# Patient Record
Sex: Female | Born: 1960 | Race: White | Hispanic: No | Marital: Single | Smoking: Former smoker
Health system: Southern US, Community
[De-identification: ages and names within clinical notes are randomized; demographics above are authoritative.]

## PROBLEM LIST (undated history)

## (undated) DIAGNOSIS — A770 Spotted fever due to Rickettsia rickettsii: Secondary | ICD-10-CM

## (undated) DIAGNOSIS — I10 Essential (primary) hypertension: Secondary | ICD-10-CM

## (undated) DIAGNOSIS — S069X9A Unspecified intracranial injury with loss of consciousness of unspecified duration, initial encounter: Secondary | ICD-10-CM

## (undated) DIAGNOSIS — S069XAA Unspecified intracranial injury with loss of consciousness status unknown, initial encounter: Secondary | ICD-10-CM

## (undated) DIAGNOSIS — K219 Gastro-esophageal reflux disease without esophagitis: Secondary | ICD-10-CM

## (undated) DIAGNOSIS — J45909 Unspecified asthma, uncomplicated: Secondary | ICD-10-CM

## (undated) DIAGNOSIS — I509 Heart failure, unspecified: Secondary | ICD-10-CM

## (undated) HISTORY — DX: Unspecified asthma, uncomplicated: J45.909

---

## 2004-07-25 ENCOUNTER — Ambulatory Visit: Payer: Self-pay

## 2004-08-26 ENCOUNTER — Emergency Department: Payer: Self-pay | Admitting: Emergency Medicine

## 2004-09-03 ENCOUNTER — Ambulatory Visit: Payer: Self-pay | Admitting: Unknown Physician Specialty

## 2010-07-18 ENCOUNTER — Emergency Department: Payer: Self-pay

## 2011-06-24 ENCOUNTER — Emergency Department: Payer: Self-pay | Admitting: *Deleted

## 2012-07-17 ENCOUNTER — Ambulatory Visit: Payer: Self-pay | Admitting: Neurology

## 2013-02-11 ENCOUNTER — Ambulatory Visit: Payer: Self-pay | Admitting: Neurology

## 2013-03-25 ENCOUNTER — Ambulatory Visit: Payer: Self-pay | Admitting: Physical Medicine and Rehabilitation

## 2013-05-26 ENCOUNTER — Ambulatory Visit: Payer: Self-pay | Admitting: Physical Medicine and Rehabilitation

## 2013-06-03 DIAGNOSIS — M2240 Chondromalacia patellae, unspecified knee: Secondary | ICD-10-CM | POA: Insufficient documentation

## 2013-06-07 DIAGNOSIS — M5416 Radiculopathy, lumbar region: Secondary | ICD-10-CM | POA: Insufficient documentation

## 2013-06-07 DIAGNOSIS — M5136 Other intervertebral disc degeneration, lumbar region: Secondary | ICD-10-CM | POA: Insufficient documentation

## 2014-01-04 DIAGNOSIS — E538 Deficiency of other specified B group vitamins: Secondary | ICD-10-CM | POA: Insufficient documentation

## 2014-01-04 DIAGNOSIS — R002 Palpitations: Secondary | ICD-10-CM | POA: Insufficient documentation

## 2014-01-04 DIAGNOSIS — R262 Difficulty in walking, not elsewhere classified: Secondary | ICD-10-CM | POA: Insufficient documentation

## 2014-03-14 DIAGNOSIS — F418 Other specified anxiety disorders: Secondary | ICD-10-CM | POA: Insufficient documentation

## 2014-03-14 DIAGNOSIS — R4589 Other symptoms and signs involving emotional state: Secondary | ICD-10-CM | POA: Insufficient documentation

## 2014-04-24 DIAGNOSIS — F0281 Dementia in other diseases classified elsewhere with behavioral disturbance: Secondary | ICD-10-CM | POA: Insufficient documentation

## 2014-04-24 DIAGNOSIS — F02C18 Dementia in other diseases classified elsewhere, severe, with other behavioral disturbance: Secondary | ICD-10-CM | POA: Insufficient documentation

## 2014-04-24 DIAGNOSIS — S069XAS Unspecified intracranial injury with loss of consciousness status unknown, sequela: Secondary | ICD-10-CM | POA: Insufficient documentation

## 2014-04-25 DIAGNOSIS — Z7289 Other problems related to lifestyle: Secondary | ICD-10-CM | POA: Insufficient documentation

## 2014-04-25 DIAGNOSIS — F1310 Sedative, hypnotic or anxiolytic abuse, uncomplicated: Secondary | ICD-10-CM | POA: Insufficient documentation

## 2014-04-25 DIAGNOSIS — Z789 Other specified health status: Secondary | ICD-10-CM | POA: Insufficient documentation

## 2014-05-21 DIAGNOSIS — S0230XA Fracture of orbital floor, unspecified side, initial encounter for closed fracture: Secondary | ICD-10-CM | POA: Insufficient documentation

## 2014-05-21 DIAGNOSIS — S02401A Maxillary fracture, unspecified, initial encounter for closed fracture: Secondary | ICD-10-CM | POA: Insufficient documentation

## 2014-05-21 DIAGNOSIS — S52609A Unspecified fracture of lower end of unspecified ulna, initial encounter for closed fracture: Secondary | ICD-10-CM | POA: Insufficient documentation

## 2017-03-19 ENCOUNTER — Other Ambulatory Visit: Payer: Self-pay

## 2017-03-19 ENCOUNTER — Emergency Department
Admission: EM | Admit: 2017-03-19 | Discharge: 2017-03-19 | Disposition: A | Discharge status: Home / Self Care | Payer: Medicaid Other | Attending: Emergency Medicine | Admitting: Emergency Medicine

## 2017-03-19 DIAGNOSIS — R51 Headache: Secondary | ICD-10-CM | POA: Insufficient documentation

## 2017-03-19 DIAGNOSIS — R61 Generalized hyperhidrosis: Secondary | ICD-10-CM | POA: Diagnosis not present

## 2017-03-19 DIAGNOSIS — I10 Essential (primary) hypertension: Secondary | ICD-10-CM | POA: Diagnosis not present

## 2017-03-19 DIAGNOSIS — R42 Dizziness and giddiness: Secondary | ICD-10-CM | POA: Insufficient documentation

## 2017-03-19 DIAGNOSIS — Z87820 Personal history of traumatic brain injury: Secondary | ICD-10-CM | POA: Diagnosis not present

## 2017-03-19 DIAGNOSIS — R55 Syncope and collapse: Secondary | ICD-10-CM

## 2017-03-19 HISTORY — DX: Unspecified intracranial injury with loss of consciousness status unknown, initial encounter: S06.9XAA

## 2017-03-19 HISTORY — DX: Gastro-esophageal reflux disease without esophagitis: K21.9

## 2017-03-19 HISTORY — DX: Spotted fever due to Rickettsia rickettsii: A77.0

## 2017-03-19 HISTORY — DX: Unspecified intracranial injury with loss of consciousness of unspecified duration, initial encounter: S06.9X9A

## 2017-03-19 HISTORY — DX: Essential (primary) hypertension: I10

## 2017-03-19 LAB — CBC
HEMATOCRIT: 45.6 % (ref 35.0–47.0)
HEMOGLOBIN: 15.3 g/dL (ref 12.0–16.0)
MCH: 30.9 pg (ref 26.0–34.0)
MCHC: 33.6 g/dL (ref 32.0–36.0)
MCV: 91.8 fL (ref 80.0–100.0)
Platelets: 177 10*3/uL (ref 150–440)
RBC: 4.96 MIL/uL (ref 3.80–5.20)
RDW: 13.2 % (ref 11.5–14.5)
WBC: 8.4 10*3/uL (ref 3.6–11.0)

## 2017-03-19 LAB — URINALYSIS, COMPLETE (UACMP) WITH MICROSCOPIC
BACTERIA UA: NONE SEEN
Bilirubin Urine: NEGATIVE
GLUCOSE, UA: NEGATIVE mg/dL
HGB URINE DIPSTICK: NEGATIVE
Ketones, ur: NEGATIVE mg/dL
Leukocytes, UA: NEGATIVE
NITRITE: NEGATIVE
PROTEIN: NEGATIVE mg/dL
Specific Gravity, Urine: 1.025 (ref 1.005–1.030)
pH: 5 (ref 5.0–8.0)

## 2017-03-19 LAB — BASIC METABOLIC PANEL
ANION GAP: 9 (ref 5–15)
BUN: 21 mg/dL — ABNORMAL HIGH (ref 6–20)
CO2: 23 mmol/L (ref 22–32)
Calcium: 8.8 mg/dL — ABNORMAL LOW (ref 8.9–10.3)
Chloride: 103 mmol/L (ref 101–111)
Creatinine, Ser: 0.44 mg/dL (ref 0.44–1.00)
GLUCOSE: 122 mg/dL — AB (ref 65–99)
POTASSIUM: 3.4 mmol/L — AB (ref 3.5–5.1)
Sodium: 135 mmol/L (ref 135–145)

## 2017-03-19 NOTE — ED Notes (Signed)
Informed RN that patient has been roomed and is ready for evaluation.  Patient in NAD at this time and call bell placed within reach.   

## 2017-03-19 NOTE — ED Notes (Signed)
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
## 2017-03-19 NOTE — ED Notes (Signed)
Report to Allison, RN

## 2017-03-19 NOTE — Discharge Instructions (Signed)
Return to the ER for new, worsening, recurrent dizziness, weakness, passing out, chest pain, difficulty breathing, recurrent or persistent severe headache, or any other new or worsening symptoms that concern you.

## 2017-03-19 NOTE — ED Provider Notes (Signed)
[facility] Emergency Department Provider Note ____________________________________________   First MD Initiated Contact with Patient 03/19/17 1851     (approximate)  I have reviewed the triage vital signs and the nursing notes.   HISTORY  Chief Complaint Near Syncope and Headache    HPI April Chang is a 57 y.o. female with past medical history as noted below who presents with near syncope, acute onset while the patient was in the grocery store, preceded by feeling dull headache as well as feeling hot in her head, sweaty, and lightheaded.  Patient states that she sat down, and the symptoms subsided over the next 20 or 30 minutes.  She denies any symptoms currently and states she feels normal.  No recent trauma or injury.  No recent illness except for an episode of "stomach flu" about 1 week ago.  The patient states that she did eat normally today.  Past Medical History:  Diagnosis Date  . GERD (gastroesophageal reflux disease)   . Hypertension   . [facility] spotted fever   . TBI (traumatic brain injury) (HCC)     There are no active problems to display for this patient.   History reviewed. No pertinent surgical history.  Prior to Admission medications   Not on File    Allergies Patient has no known allergies.  History reviewed. No pertinent family history.  Social History Social History   Tobacco Use  . Smoking status: Never Smoker  Substance Use Topics  . Alcohol use: No    Frequency: Never  . Drug use: Not on file    Review of Systems  Constitutional: No fever/chills. Eyes: No visual changes. ENT: No sore throat. Cardiovascular: Denies chest pain. Respiratory: Denies shortness of breath. Gastrointestinal: No nausea, no vomiting.  No diarrhea.  Genitourinary: Negative for dysuria or hematuria.  Musculoskeletal: Negative for back pain. Skin: Negative for rash. Neurological: Positive for resolved  headache.   ____________________________________________   PHYSICAL EXAM:  VITAL SIGNS: ED Triage Vitals  Enc Vitals Group     BP 03/19/17 1745 112/89     Pulse Rate 03/19/17 1745 (!) 102     Resp 03/19/17 1745 18     Temp 03/19/17 1745 98.6 F (37 C)     Temp Source 03/19/17 1745 Oral     SpO2 03/19/17 1745 98 %     Weight 03/19/17 1753 140 lb (63.5 kg)     Height 03/19/17 1753 5\' 2"  (1.575 m)     Head Circumference --      Peak Flow --      Pain Score 03/19/17 1752 0     Pain Loc --      Pain Edu? --      Excl. in GC? --     Constitutional: Alert and oriented. Well appearing and in no acute distress. Eyes: Conjunctivae are normal.  EOMI.  PERRLA. Head: Atraumatic. Nose: No congestion/rhinnorhea. Mouth/Throat: Mucous membranes are moist.   Neck: Normal range of motion.  Cardiovascular: Normal rate, regular rhythm. Grossly normal heart sounds.  Good peripheral circulation. Respiratory: Normal respiratory effort.  No retractions. Lungs CTAB. Gastrointestinal: No distention.  Musculoskeletal:  Extremities warm and well perfused.  Neurologic:  Normal speech and language.  Motor and sensory intact in all extremities.  Normal coordination with no ataxia.  Skin:  Skin is warm and dry. No rash noted. Psychiatric: Mood and affect are normal. Speech and behavior are normal.  ____________________________________________   LABS (all labs ordered are listed,  but only abnormal results are displayed)  Labs Reviewed  BASIC METABOLIC PANEL - Abnormal; Notable for the following components:      Result Value   Potassium 3.4 (*)    Glucose, Bld 122 (*)    BUN 21 (*)    Calcium 8.8 (*)    All other components within normal limits  URINALYSIS, COMPLETE (UACMP) WITH MICROSCOPIC - Abnormal; Notable for the following components:   Color, Urine YELLOW (*)    APPearance HAZY (*)    Squamous Epithelial / LPF 0-5 (*)    All other components within normal limits  CBC    ____________________________________________  EKG  ED ECG REPORT I, Dionne BucySebastian Deckard Stuber, the attending physician, personally viewed and interpreted this ECG.  Date: 03/19/2017 EKG Time: 1749 Rate: 99 Rhythm: normal sinus rhythm QRS Axis: normal Intervals: normal ST/T Wave abnormalities: Junctional ST depression Narrative Interpretation: no evidence of acute ischemia; no prior EKG available for comparison  ____________________________________________  RADIOLOGY    ____________________________________________   PROCEDURES  Procedure(s) performed: No  Procedures  Critical Care performed: No ____________________________________________   INITIAL IMPRESSION / ASSESSMENT AND PLAN / ED COURSE  Pertinent labs & imaging results that were available during my care of the patient were reviewed by me and considered in my medical decision making (see chart for details).  57 year old female status post TBI and with other PMH as noted above presents with an episode of near syncope while she was in the grocery store today, associated with headache, but now resolved.  Patient is asymptomatic at this time.  Past medical records reviewed in Epic and are noncontributory.  On exam, the patient is well-appearing, the vital signs are normal, his neuro exam is nonfocal, and the remainder the exam is unremarkable.  Presentation is consistent with likely vasovagal near syncope or other benign cause such as mild dehydration.  Initial lab workup is normal.  Neuro exam is nonfocal.  Given that the headache has resolved, but there is no evidence of ICH or other CNS cause, and no indication for imaging.  The patient feels well and would like to go home.  No indication for additional ED workup or observation at this time.  Return precautions given, and the patient and her sister who is with her expressed understanding.   ____________________________________________   FINAL CLINICAL  IMPRESSION(S) / ED DIAGNOSES  Final diagnoses:  Near syncope      NEW MEDICATIONS STARTED DURING THIS VISIT:  There are no discharge medications for this patient.    Note:  This document was prepared using Dragon voice recognition software and may include unintentional dictation errors.    Dionne BucySiadecki, Niklaus Mamaril, MD 03/19/17 2007

## 2017-03-19 NOTE — ED Notes (Signed)
Pt at grocery store with friend earlier today, became flushed and diaphoretic c/o headache. Pt brought to ER after incident which stopped once she got into the car. Has not had any more episodes since. At baseline mentality with short term memory loss, hx of TBI.

## 2020-03-10 DIAGNOSIS — Z20828 Contact with and (suspected) exposure to other viral communicable diseases: Secondary | ICD-10-CM | POA: Diagnosis not present

## 2020-03-21 DIAGNOSIS — I10 Essential (primary) hypertension: Secondary | ICD-10-CM | POA: Diagnosis not present

## 2020-03-21 DIAGNOSIS — S062X9S Diffuse traumatic brain injury with loss of consciousness of unspecified duration, sequela: Secondary | ICD-10-CM | POA: Diagnosis not present

## 2020-03-21 DIAGNOSIS — M159 Polyosteoarthritis, unspecified: Secondary | ICD-10-CM | POA: Diagnosis not present

## 2020-03-21 DIAGNOSIS — R269 Unspecified abnormalities of gait and mobility: Secondary | ICD-10-CM | POA: Diagnosis not present

## 2020-03-21 DIAGNOSIS — Z87891 Personal history of nicotine dependence: Secondary | ICD-10-CM | POA: Diagnosis not present

## 2020-03-21 DIAGNOSIS — R4189 Other symptoms and signs involving cognitive functions and awareness: Secondary | ICD-10-CM | POA: Diagnosis not present

## 2020-03-21 DIAGNOSIS — R21 Rash and other nonspecific skin eruption: Secondary | ICD-10-CM | POA: Diagnosis not present

## 2020-04-13 DIAGNOSIS — Z79899 Other long term (current) drug therapy: Secondary | ICD-10-CM | POA: Diagnosis not present

## 2020-04-13 DIAGNOSIS — I10 Essential (primary) hypertension: Secondary | ICD-10-CM | POA: Diagnosis not present

## 2020-05-21 DIAGNOSIS — Z419 Encounter for procedure for purposes other than remedying health state, unspecified: Secondary | ICD-10-CM | POA: Diagnosis not present

## 2020-06-21 DIAGNOSIS — Z419 Encounter for procedure for purposes other than remedying health state, unspecified: Secondary | ICD-10-CM | POA: Diagnosis not present

## 2020-06-22 DIAGNOSIS — Z79899 Other long term (current) drug therapy: Secondary | ICD-10-CM | POA: Diagnosis not present

## 2020-08-03 DIAGNOSIS — F321 Major depressive disorder, single episode, moderate: Secondary | ICD-10-CM | POA: Diagnosis not present

## 2020-08-03 DIAGNOSIS — F5101 Primary insomnia: Secondary | ICD-10-CM | POA: Diagnosis not present

## 2020-08-16 DIAGNOSIS — R21 Rash and other nonspecific skin eruption: Secondary | ICD-10-CM | POA: Diagnosis not present

## 2020-08-16 DIAGNOSIS — I10 Essential (primary) hypertension: Secondary | ICD-10-CM | POA: Diagnosis not present

## 2020-08-16 DIAGNOSIS — R269 Unspecified abnormalities of gait and mobility: Secondary | ICD-10-CM | POA: Diagnosis not present

## 2020-08-16 DIAGNOSIS — R4189 Other symptoms and signs involving cognitive functions and awareness: Secondary | ICD-10-CM | POA: Diagnosis not present

## 2020-08-30 ENCOUNTER — Encounter: Payer: Self-pay | Admitting: Podiatry

## 2020-08-30 ENCOUNTER — Other Ambulatory Visit: Payer: Self-pay

## 2020-08-30 ENCOUNTER — Ambulatory Visit (INDEPENDENT_AMBULATORY_CARE_PROVIDER_SITE_OTHER): Payer: Medicaid Other | Admitting: Podiatry

## 2020-08-30 DIAGNOSIS — R4020 Unspecified coma: Secondary | ICD-10-CM | POA: Insufficient documentation

## 2020-08-30 DIAGNOSIS — B019 Varicella without complication: Secondary | ICD-10-CM | POA: Insufficient documentation

## 2020-08-30 DIAGNOSIS — A938 Other specified arthropod-borne viral fevers: Secondary | ICD-10-CM | POA: Insufficient documentation

## 2020-08-30 DIAGNOSIS — S069X9A Unspecified intracranial injury with loss of consciousness of unspecified duration, initial encounter: Secondary | ICD-10-CM | POA: Insufficient documentation

## 2020-08-30 DIAGNOSIS — L409 Psoriasis, unspecified: Secondary | ICD-10-CM | POA: Insufficient documentation

## 2020-08-30 DIAGNOSIS — Z0000 Encounter for general adult medical examination without abnormal findings: Secondary | ICD-10-CM | POA: Diagnosis not present

## 2020-08-30 DIAGNOSIS — J45909 Unspecified asthma, uncomplicated: Secondary | ICD-10-CM | POA: Insufficient documentation

## 2020-08-30 DIAGNOSIS — S069XAA Unspecified intracranial injury with loss of consciousness status unknown, initial encounter: Secondary | ICD-10-CM | POA: Insufficient documentation

## 2020-08-30 DIAGNOSIS — I10 Essential (primary) hypertension: Secondary | ICD-10-CM | POA: Insufficient documentation

## 2020-08-30 DIAGNOSIS — K259 Gastric ulcer, unspecified as acute or chronic, without hemorrhage or perforation: Secondary | ICD-10-CM | POA: Insufficient documentation

## 2020-08-30 NOTE — Progress Notes (Signed)
  Subjective:  Patient ID: April Chang, female    DOB: 11/12/1960,  MRN: [id number]  Chief Complaint  Patient presents with   Nail Problem    Thick painful toenails    60 y.o. female present she was referred here by her PCP but she does not know why.  Her caregivers do not know why either.  Objective:  Physical Exam: warm, good capillary refill, no trophic changes or ulcerative lesions, normal DP and PT pulses, and normal sensory exam. Left Foot: normal exam, no swelling, tenderness, instability; ligaments intact, full range of motion of all ankle/foot joints Right Foot: normal exam, no swelling, tenderness, instability; ligaments intact, full range of motion of all ankle/foot joints  Assessment:   1. Healthy adult on routine physical examination      Plan:  Patient was evaluated and treated and all questions answered.  She has no problems with her feet currently.  Return to see me as needed if she develops any issues or has difficulty walking.  Discussed with her she would not qualify for routine regular care but could be a out of pocket if she would like  Return if symptoms worsen or fail to improve.

## 2020-09-21 DIAGNOSIS — Z419 Encounter for procedure for purposes other than remedying health state, unspecified: Secondary | ICD-10-CM | POA: Diagnosis not present

## 2020-10-05 DIAGNOSIS — E785 Hyperlipidemia, unspecified: Secondary | ICD-10-CM | POA: Diagnosis not present

## 2020-10-05 DIAGNOSIS — Z79899 Other long term (current) drug therapy: Secondary | ICD-10-CM | POA: Diagnosis not present

## 2020-10-05 DIAGNOSIS — I10 Essential (primary) hypertension: Secondary | ICD-10-CM | POA: Diagnosis not present

## 2020-10-12 DIAGNOSIS — I10 Essential (primary) hypertension: Secondary | ICD-10-CM | POA: Diagnosis not present

## 2020-10-12 DIAGNOSIS — E782 Mixed hyperlipidemia: Secondary | ICD-10-CM | POA: Diagnosis not present

## 2020-10-12 DIAGNOSIS — E785 Hyperlipidemia, unspecified: Secondary | ICD-10-CM | POA: Diagnosis not present

## 2020-10-21 DIAGNOSIS — Z419 Encounter for procedure for purposes other than remedying health state, unspecified: Secondary | ICD-10-CM | POA: Diagnosis not present

## 2020-11-21 DIAGNOSIS — Z419 Encounter for procedure for purposes other than remedying health state, unspecified: Secondary | ICD-10-CM | POA: Diagnosis not present

## 2020-12-09 ENCOUNTER — Emergency Department
Admission: EM | Admit: 2020-12-09 | Discharge: 2020-12-09 | Disposition: A | Discharge status: Home / Self Care | Payer: Medicaid Other | Attending: Emergency Medicine | Admitting: Emergency Medicine

## 2020-12-09 ENCOUNTER — Other Ambulatory Visit: Payer: Self-pay

## 2020-12-09 DIAGNOSIS — Z79899 Other long term (current) drug therapy: Secondary | ICD-10-CM | POA: Diagnosis not present

## 2020-12-09 DIAGNOSIS — R21 Rash and other nonspecific skin eruption: Secondary | ICD-10-CM | POA: Diagnosis present

## 2020-12-09 DIAGNOSIS — I10 Essential (primary) hypertension: Secondary | ICD-10-CM | POA: Insufficient documentation

## 2020-12-09 DIAGNOSIS — L409 Psoriasis, unspecified: Secondary | ICD-10-CM | POA: Insufficient documentation

## 2020-12-09 DIAGNOSIS — J45909 Unspecified asthma, uncomplicated: Secondary | ICD-10-CM | POA: Diagnosis not present

## 2020-12-09 MED ORDER — CLOTRIMAZOLE-BETAMETHASONE 1-0.05 % EX CREA: TOPICAL_CREAM | CUTANEOUS | 3 refills | Status: DC

## 2020-12-09 NOTE — ED Notes (Signed)
Provider reviewed discharge instructions, follow-up carewith patient. Patient verbalized understanding of all information reviewed. Patient stable, with no distress noted at this time.

## 2020-12-09 NOTE — ED Triage Notes (Signed)
Pt to ED with sister for rash under breasts, under arms, and at groin area for over 6 weeks. Was recently taken out of facility. TBI

## 2020-12-09 NOTE — ED Provider Notes (Signed)
[facility] Emergency Department Provider Note  ____________________________________________   Event Date/Time   First MD Initiated Contact with Patient 12/09/20 1429     (approximate)  I have reviewed the triage vital signs and the nursing notes.   HISTORY  Chief Complaint Rash    HPI Allea Kassner is a 60 y.o. female presents emergency department with a rash under her breast, underarms and between her legs for over 6 weeks.  Patient has a history of psoriasis.  States she has had a large outbreak after staying in [facility].  Does not have any cream to apply to it.  Has an appointment with dermatology in 1 month.  Past Medical History:  Diagnosis Date   GERD (gastroesophageal reflux disease)    Hypertension    [facility] spotted fever    TBI (traumatic brain injury)     Patient Active Problem List   Diagnosis Date Noted   Asthma without status asthmaticus 08/30/2020   Brain trauma 08/30/2020   Chicken pox 08/30/2020   Coma (HCC) 08/30/2020   Gastric ulcer 08/30/2020   Hypertension 08/30/2020   Psoriasis 08/30/2020   Tick fever 08/30/2020   Closed fracture of distal end of ulna 05/21/2014   Closed fracture of maxillary sinus (HCC) 05/21/2014   Closed fracture of orbital floor (HCC) 05/21/2014   Alcohol use 04/25/2014   Benzodiazepine abuse (HCC) 04/25/2014   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  Prior to Admission medications   Medication Sig Start Date End Date Taking? Authorizing Provider  clotrimazole-betamethasone (LOTRISONE) cream Apply to affected area 2 times  daily 12/09/20 12/09/21 Yes Ariahna Smiddy, Roselyn Bering, PA-C  acetaminophen (TYLENOL) 650 MG CR tablet Take by mouth.    [provider]  Artificial Tear Solution (JUST TEARS EYE DROPS) SOLN Administer 1 drop to both eyes Three (3) times a day. 05/21/14   [provider]  B Complex CAPS Take by mouth. 07/21/20   [provider]  baclofen (LIORESAL) 10 MG tablet Take 1 tablet by mouth daily. 01/04/14   [provider]  Cholecalciferol 25 MCG (1000 UT) tablet Take by mouth.    [provider]  cyanocobalamin 1000 MCG tablet Take by mouth.    [provider]  fiber (NUTRISOURCE FIBER) PACK packet Take by mouth. 05/21/14   [provider]  gabapentin (NEURONTIN) 100 MG capsule 1 po qhs x 4 days, then bid x 4 days, then tid thereafter. 06/07/13   [provider]  ibuprofen (ADVIL) 200 MG tablet Take by mouth.    [provider]  meloxicam (MOBIC) 15 MG tablet Take by mouth. 07/21/20   [provider]  metoprolol tartrate (LOPRESSOR) 25 MG tablet Take by mouth. 07/21/20   [provider]  [facility] powder Apply topically. 08/10/20   [provider]  OLANZapine (ZYPREXA) 2.5 MG tablet Take by mouth. 05/21/14   [provider]  rosuvastatin (CRESTOR) 5 MG tablet Take by mouth. 07/21/20   [provider]  sertraline (ZOLOFT) 50 MG tablet Take by mouth. 07/21/20   [provider]  tacrolimus (PROTOPIC) 0.1 % ointment SMARTSIG:1 Topical Daily PRN 08/17/20   [provider]  traZODone (DESYREL) 50 MG tablet Take by mouth. 05/21/14   [provider]    Allergies Patient has no known allergies.  No family history on file.  Social History Social History   Tobacco Use   Smoking status: Never  Substance Use Topics   Alcohol use: No    Review of Systems  Constitutional: No fever/chills Eyes: No visual changes. ENT: No sore throat. Respiratory: Denies cough Cardiovascular:  Denies chest pain Gastrointestinal: Denies abdominal pain Genitourinary: Negative for dysuria. Musculoskeletal: Negative for back pain. Skin: Positive for rash. Psychiatric: no mood changes,     ____________________________________________   PHYSICAL EXAM:  VITAL SIGNS: ED Triage Vitals  Enc Vitals Group     BP 12/09/20 1425 (!) 128/99     Pulse Rate 12/09/20 1425 98     Resp 12/09/20 1425 20     Temp 12/09/20 1425 98 F (36.7 C)     Temp Source 12/09/20 1425 Oral     SpO2 12/09/20 1425 95 %     Weight 12/09/20 1426 160 lb (72.6 kg)     Height 12/09/20 1426 5\' 7"  (1.702 m)     Head Circumference --      Peak Flow --      Pain Score 12/09/20 1425 6     Pain Loc --      Pain Edu? --      Excl. in GC? --     Constitutional: Alert and oriented. Well appearing and in no acute distress. Eyes: Conjunctivae are normal.  Head: Atraumatic. Nose: No congestion/rhinnorhea. Mouth/Throat: Mucous membranes are moist.   Neck:  supple no lymphadenopathy noted Cardiovascular: Normal rate, regular rhythm. Heart sounds are normal Respiratory: Normal respiratory effort.  No retractions, lungs c t a  GU: deferred Musculoskeletal: FROM all extremities, warm and well perfused Neurologic:  Normal speech and language.  Skin:  Skin is warm, dry and intact.  Large red areas of psoriasis noted under the breast, underneath arms, and in between the legs, flaky scaly skin noted at the back of the neck typical of psoriasis, no drainage noted  psychiatric: Mood and affect are normal. Speech and behavior are normal.  ____________________________________________   LABS (all labs ordered are listed, but only abnormal results are displayed)  Labs Reviewed - No data to display ____________________________________________   ____________________________________________  RADIOLOGY    ____________________________________________   PROCEDURES  Procedure(s) performed:  No  Procedures    ____________________________________________   INITIAL IMPRESSION / ASSESSMENT AND PLAN / ED COURSE  Pertinent labs & imaging results that were available during my care of the patient were reviewed by me and considered in my medical decision making (see chart for details).   Patient is 60 year old female presents with rash.  See HPI.  Physical exam shows patient be stable.  Due to the concerns of psoriasis and questionable yeast under her breast hysterometry is a so that she will benefit from the antifungal and steroid pain.  The patient keep her appointment with dermatology.  Return emergency department worsening.  She was discharged stable condition and in agreement with treatment plan     Yaffa Szeliga was evaluated in Emergency Department on 12/09/2020 for the symptoms described in the history of present illness. She was evaluated in the context of the global COVID-19 pandemic, which necessitated consideration that the patient might be at risk for infection with the SARS-CoV-2 virus that causes COVID-19. Institutional protocols and algorithms that pertain to the evaluation of patients at  risk for COVID-19 are in a state of rapid change based on information released by regulatory bodies including the CDC and federal and state organizations. These policies and algorithms were followed during the patient's care in the ED.    As part of my medical decision making, I reviewed the following data within the electronic MEDICAL RECORD NUMBER History obtained from family, Nursing notes reviewed and incorporated, Old chart reviewed, Notes from prior ED visits, and [place] Controlled Substance Database  ____________________________________________   FINAL CLINICAL IMPRESSION(S) / ED DIAGNOSES  Final diagnoses:  Psoriasis      NEW MEDICATIONS STARTED DURING THIS VISIT:  Discharge Medication List as of 12/09/2020  2:43 PM     START taking these medications   Details   clotrimazole-betamethasone (LOTRISONE) cream Apply to affected area 2 times daily, Normal         Note:  This document was prepared using Dragon voice recognition software and may include unintentional dictation errors.    Faythe Ghee, PA-C 12/09/20 1544    Sharyn Creamer, MD 12/09/20 [facility] [place]

## 2020-12-11 ENCOUNTER — Telehealth: Payer: Self-pay

## 2020-12-11 NOTE — Telephone Encounter (Signed)
Transition Care Management Unsuccessful Follow-up Telephone Call  Date of discharge and from where:  12/09/2020 from [facility]  Attempts:  1st Attempt  Reason for unsuccessful TCM follow-up call:  Left voice message    

## 2020-12-12 NOTE — Telephone Encounter (Signed)
Transition Care Management Follow-up Telephone Call Date of discharge and from where: 12/09/2020 from [facility] How have you been since you were released from the hospital? Spoke to pt sister and gaurdian Barbaraann Share).  Any questions or concerns? No  Items Reviewed: Did the pt receive and understand the discharge instructions provided? Yes  Medications obtained and verified? Yes  Other? No  Any new allergies since your discharge? No  Dietary orders reviewed? No Do you have support at home? Yes   Functional Questionnaire: (I = Independent and D = Dependent) ADLs: I  Bathing/Dressing- I  Meal Prep- I  Eating- I  Maintaining continence- I  Transferring/Ambulation- I  Managing Meds- I  Follow up appointments reviewed:  PCP Hospital f/u appt confirmed? No   Specialist Hospital f/u appt confirmed? Yes  Scheduled to see Sandi Mealy, MD on 01/09/2021 @ 1:40 pm. Are transportation arrangements needed? No  If their condition worsens, is the pt aware to call PCP or go to the Emergency Dept.? Yes Was the patient provided with contact information for the PCP's office or ED? Yes Was to pt encouraged to call back with questions or concerns? Yes

## 2020-12-21 DIAGNOSIS — Z419 Encounter for procedure for purposes other than remedying health state, unspecified: Secondary | ICD-10-CM | POA: Diagnosis not present

## 2021-01-03 DIAGNOSIS — M2550 Pain in unspecified joint: Secondary | ICD-10-CM | POA: Diagnosis not present

## 2021-01-03 DIAGNOSIS — Z0130 Encounter for examination of blood pressure without abnormal findings: Secondary | ICD-10-CM | POA: Diagnosis not present

## 2021-01-03 DIAGNOSIS — H6123 Impacted cerumen, bilateral: Secondary | ICD-10-CM | POA: Diagnosis not present

## 2021-01-03 DIAGNOSIS — K219 Gastro-esophageal reflux disease without esophagitis: Secondary | ICD-10-CM | POA: Diagnosis not present

## 2021-01-03 DIAGNOSIS — L409 Psoriasis, unspecified: Secondary | ICD-10-CM | POA: Diagnosis not present

## 2021-01-03 DIAGNOSIS — Z13220 Encounter for screening for lipoid disorders: Secondary | ICD-10-CM | POA: Diagnosis not present

## 2021-01-03 DIAGNOSIS — Z131 Encounter for screening for diabetes mellitus: Secondary | ICD-10-CM | POA: Diagnosis not present

## 2021-01-03 DIAGNOSIS — Z1159 Encounter for screening for other viral diseases: Secondary | ICD-10-CM | POA: Diagnosis not present

## 2021-01-03 DIAGNOSIS — I10 Essential (primary) hypertension: Secondary | ICD-10-CM | POA: Diagnosis not present

## 2021-01-03 DIAGNOSIS — Z1389 Encounter for screening for other disorder: Secondary | ICD-10-CM | POA: Diagnosis not present

## 2021-01-03 DIAGNOSIS — Z7189 Other specified counseling: Secondary | ICD-10-CM | POA: Diagnosis not present

## 2021-01-05 ENCOUNTER — Other Ambulatory Visit: Payer: Self-pay | Admitting: Student

## 2021-01-05 DIAGNOSIS — Z1231 Encounter for screening mammogram for malignant neoplasm of breast: Secondary | ICD-10-CM

## 2021-01-09 ENCOUNTER — Ambulatory Visit: Payer: Medicaid Other | Admitting: Dermatology

## 2021-01-11 ENCOUNTER — Ambulatory Visit: Payer: Medicaid Other | Admitting: Dermatology

## 2021-01-21 DIAGNOSIS — Z419 Encounter for procedure for purposes other than remedying health state, unspecified: Secondary | ICD-10-CM | POA: Diagnosis not present

## 2021-02-13 ENCOUNTER — Ambulatory Visit (INDEPENDENT_AMBULATORY_CARE_PROVIDER_SITE_OTHER): Payer: Medicaid Other | Admitting: Dermatology

## 2021-02-13 ENCOUNTER — Other Ambulatory Visit: Payer: Self-pay

## 2021-02-13 DIAGNOSIS — L409 Psoriasis, unspecified: Secondary | ICD-10-CM | POA: Diagnosis not present

## 2021-02-13 MED ORDER — VTAMA 1 % EX CREA: 1.0000 "application " | TOPICAL_CREAM | Freq: Every day | CUTANEOUS | 2 refills | Status: DC
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
## 2021-02-13 NOTE — Patient Instructions (Addendum)
Psoriasis is a chronic non-curable, but treatable genetic/hereditary disease that may have other systemic features affecting other organ systems such as joints (Psoriatic Arthritis). It is associated with an increased risk of inflammatory bowel disease, heart disease, non-alcoholic fatty liver disease, and depression.    Side effects of Otezla (apremilast) include diarrhea, nausea, headache, upper respiratory infection, depression, and weight decrease (5-10%). It should only be taken by pregnant women after a discussion regarding risks and benefits with their doctor. Goal is control of skin condition, not cure.  The use of [place] requires long term medication management, including periodic office visits.  If You Need Anything After Your Visit  If you have any questions or concerns for your doctor, please call our main line at [phone] and press option 4 to reach your doctor's medical assistant. If no one answers, please leave a voicemail as directed and we will return your call as soon as possible. Messages left after 4 pm will be answered the following business day.   You may also send [place] a message via MyChart. We typically respond to MyChart messages within 1-2 business days.  For prescription refills, please ask your pharmacy to contact our office. Our fax number is [phone].  If you have an urgent issue when the clinic is closed that cannot wait until the next business day, you can page your doctor at the number below.    Please note that while we do our best to be available for urgent issues outside of office hours, we are not available 24/7.   If you have an urgent issue and are unable to reach [place], you may choose to seek medical care at your doctor's office, retail clinic, urgent care center, or emergency room.  If you have a medical emergency, please immediately call 911 or go to the emergency department.  Pager Numbers  - Dr. Gwen Pounds: [phone]  - Dr. Neale Burly:  [phone]  - Dr. Roseanne Reno: [phone]  In the event of inclement weather, please call our main line at [phone] for an update on the status of any delays or closures.  Dermatology Medication Tips: Please keep the boxes that topical medications come in in order to help keep track of the instructions about where and how to use these. Pharmacies typically print the medication instructions only on the boxes and not directly on the medication tubes.   If your medication is too expensive, please contact our office at [phone] option 4 or send [place] a message through MyChart.   We are unable to tell what your co-pay for medications will be in advance as this is different depending on your insurance coverage. However, we may be able to find a substitute medication at lower cost or fill out paperwork to get insurance to cover a needed medication.   If a prior authorization is required to get your medication covered by your insurance company, please allow [place] 1-2 business days to complete this process.  Drug prices often vary depending on where the prescription is filled and some pharmacies may offer cheaper prices.  The website www.goodrx.com contains coupons for medications through different pharmacies. The prices here do not account for what the cost may be with help from insurance (it may be cheaper with your insurance), but the website can give you the price if you did not use any insurance.  - You can print the associated coupon and take it with your prescription to the pharmacy.  - You may also stop by our office during regular  business hours and pick up a GoodRx coupon card.  - If you need your prescription sent electronically to a different pharmacy, notify our office through [facility] or by phone at [phone] option 4.     Si Usted Necesita Algo Despus de Su Visita  Tambin puede enviarnos un mensaje a travs de Clinical cytogeneticist. Por lo general respondemos a los mensajes de  MyChart en el transcurso de 1 a 2 das hbiles.  Para renovar recetas, por favor pida a su farmacia que se ponga en contacto con nuestra oficina. Annie Sable de fax es [place] [phone].  Si tiene un asunto urgente cuando la clnica est cerrada y que no puede esperar hasta el siguiente da hbil, puede llamar/localizar a su doctor(a) al nmero que aparece a continuacin.   Por favor, tenga en cuenta que aunque hacemos todo lo posible para estar disponibles para asuntos urgentes fuera del horario de [place], no estamos disponibles las 24 horas del da, los 7 [address]  [address] de la [place].   Si tiene un problema urgente y no puede comunicarse con nosotros, puede optar por buscar atencin mdica  en el consultorio de su doctor(a), en una clnica privada, en un centro de atencin urgente o en una sala de emergencias.  Si tiene Engineer, drilling, por favor llame inmediatamente al 911 o vaya a la sala de emergencias.  Nmeros de bper  - Dr. Gwen Pounds: [phone]  - Dra. Moye: [phone]  - Dra. Roseanne Reno: [phone]  En caso de inclemencias del [place], por favor llame a Lacy Duverney principal al [phone] para una actualizacin sobre el [place] de cualquier retraso o cierre.  Consejos para la medicacin en dermatologa: Por favor, guarde las cajas en las que vienen los medicamentos de uso tpico para ayudarle a seguir las instrucciones sobre dnde y cmo usarlos. Las farmacias generalmente imprimen las instrucciones del medicamento slo en las cajas y no directamente en los tubos del [place].   Si su medicamento es muy caro, por favor, pngase en contacto con Rolm Gala llamando al [phone] y presione la opcin 4 o envenos un mensaje a travs de Clinical cytogeneticist.   No podemos decirle cul ser su copago por los medicamentos por adelantado ya que esto es diferente dependiendo de la cobertura de su seguro. Sin embargo, es posible que podamos encontrar un medicamento sustituto a Insurance account manager un formulario para que el seguro cubra el medicamento que se considera necesario.   Si se requiere una autorizacin previa para que su compaa de seguros [place] su medicamento, por favor permtanos de 1 a 2 das hbiles para completar [address]  Los precios de los medicamentos varan con frecuencia dependiendo del Environmental consultant de dnde se surte la receta y alguna farmacias pueden ofrecer precios ms baratos.  El sitio web www.goodrx.com tiene cupones para medicamentos de Health and safety inspector. Los precios aqu no tienen en cuenta lo que podra costar con la ayuda del seguro (puede ser ms barato con su seguro), pero el sitio web puede darle el precio si no utiliz Tourist information centre manager.  - Puede imprimir el cupn correspondiente y llevarlo con su receta a la farmacia.  - Tambin puede pasar por nuestra oficina durante el horario de atencin regular y Education officer, museum una tarjeta de cupones de GoodRx.  - Si necesita que su receta se enve electrnicamente a una farmacia diferente, informe a nuestra oficina a travs de MyChart de [place] o por telfono llamando al [phone] y presione la opcin 4.

## 2021-02-16 ENCOUNTER — Encounter: Payer: Self-pay | Admitting: Dermatology

## 2021-02-21 DIAGNOSIS — Z419 Encounter for procedure for purposes other than remedying health state, unspecified: Secondary | ICD-10-CM | POA: Diagnosis not present

## 2021-02-26 ENCOUNTER — Telehealth: Payer: Self-pay

## 2021-02-26 NOTE — Telephone Encounter (Signed)
Pt called April Chang on VM no improvement on her rash,    Called pt April Chang on VM please return my call to discuss which cream is she using on her rash

## 2021-02-27 ENCOUNTER — Telehealth: Payer: Self-pay

## 2021-02-27 NOTE — Telephone Encounter (Signed)
Recommend adding clobetasol solution twice a day as needed. Avoid applying to face, groin, and axilla. Use as directed. Long-term use can cause thinning of the skin which is not usually much of an issue at the scalp but is more of a concern at other body locations. Thank you!

## 2021-02-27 NOTE — Telephone Encounter (Signed)
Pt's sister  calling back to see if Dr Neale Burly can prescribe Fluocinonide solution because it worked in the past,

## 2021-02-27 NOTE — Telephone Encounter (Signed)
Patient's sister called about currently flare in scalp. They state the Dwaine Gale is now working and asking for something different to use in that area.

## 2021-02-28 MED ORDER — CLOBETASOL PROPIONATE 0.05 % EX SOLN: 1.0000 "application " | Freq: Two times a day (BID) | CUTANEOUS | 0 refills | Status: DC

## 2021-02-28 NOTE — Telephone Encounter (Signed)
Sure. The clobetasol is stronger and may be more effective but the fluocinonide is fine if that's what they prefer.

## 2021-02-28 NOTE — Telephone Encounter (Signed)
Patient's sister advised and RX sent in. She decided to try and use Clobetasol Solution. aw

## 2021-03-02 DIAGNOSIS — Z0130 Encounter for examination of blood pressure without abnormal findings: Secondary | ICD-10-CM | POA: Diagnosis not present

## 2021-03-02 DIAGNOSIS — Z0000 Encounter for general adult medical examination without abnormal findings: Secondary | ICD-10-CM | POA: Diagnosis not present

## 2021-03-02 DIAGNOSIS — I10 Essential (primary) hypertension: Secondary | ICD-10-CM | POA: Diagnosis not present

## 2021-03-02 DIAGNOSIS — K219 Gastro-esophageal reflux disease without esophagitis: Secondary | ICD-10-CM | POA: Diagnosis not present

## 2021-03-02 DIAGNOSIS — J3089 Other allergic rhinitis: Secondary | ICD-10-CM | POA: Diagnosis not present

## 2021-03-13 ENCOUNTER — Ambulatory Visit (INDEPENDENT_AMBULATORY_CARE_PROVIDER_SITE_OTHER): Payer: Medicaid Other | Admitting: Dermatology

## 2021-03-13 ENCOUNTER — Other Ambulatory Visit: Payer: Self-pay

## 2021-03-13 ENCOUNTER — Encounter: Payer: Self-pay | Admitting: Dermatology

## 2021-03-13 DIAGNOSIS — L409 Psoriasis, unspecified: Secondary | ICD-10-CM

## 2021-03-13 DIAGNOSIS — Z79899 Other long term (current) drug therapy: Secondary | ICD-10-CM

## 2021-03-13 MED ORDER — ENSTILAR 0.005-0.064 % EX FOAM: CUTANEOUS | 2 refills | Status: DC

## 2021-03-13 NOTE — Progress Notes (Signed)
° °  Follow-Up Visit   Subjective  April Chang is a 61 y.o. female who presents for the following: Psoriasis (1 month f/u Psoriasis on scalp and chest, treating chest with Vtama cream with a good response, treating scalp with Clobetasol solution with a poor response).  Sister with patient   The following portions of the chart were reviewed this encounter and updated as appropriate:   Tobacco   Allergies   Meds   Problems   Med Hx   Surg Hx   Fam Hx       Review of Systems:  No other skin or systemic complaints except as noted in HPI or Assessment and Plan.  Objective  Well appearing patient in no apparent distress; mood and affect are within normal limits.  A focused examination was performed including scalp,chest. Relevant physical exam findings are noted in the Assessment and Plan.  chest,scalp Thick scaly plaques wide spread over the scalp, rare scaly pink plaque at back, erythematous patches at chest and inframammary     Assessment & Plan  Psoriasis chest,scalp  Chronic and persistent condition with duration or expected duration over one year. Condition is bothersome/symptomatic for patient. Currently flared at scalp despite topical clobetasol solution. She was unable to spread Vtama effectively at the scalp.  Psoriasis is a chronic non-curable, but treatable genetic/hereditary disease that may have other systemic features affecting other organ systems such as joints (Psoriatic Arthritis). It is associated with an increased risk of inflammatory bowel disease, heart disease, non-alcoholic fatty liver disease, and depression.     BSA 5%  If no better in the future we may consider Xtrac or biologic   Start samples of Otezla, titrate up to BID (2 packets given) Lot [id number] Exp May 31,2024  Side effects of Otezla (apremilast) include diarrhea, nausea, headache, upper respiratory infection, depression, and weight decrease (5-10%). It should only be taken by pregnant women  after a discussion regarding risks and benefits with their doctor. Goal is control of skin condition, not cure.  The use of [place] requires long term medication management, including periodic office visits.  -Negative history of depression   Start Enstilar foam apply to scalp daily  Start otc T-gel or T-sal or T-gel shampoo  Related Medications Tapinarof (VTAMA) 1 % CREA Apply 1 application topically daily. To affected areas of psoriasis   Return in about 4 weeks (around 04/10/2021) for Psoriasis .  I, Angelique Holm, CMA, am acting as scribe for Darden Dates, MD .   Documentation: I have reviewed the above documentation for accuracy and completeness, and I agree with the above.  Darden Dates, MD

## 2021-03-13 NOTE — Patient Instructions (Addendum)
Start over the counter T- sal shampoo or T-gel shampoo   If You Need Anything After Your Visit  If you have any questions or concerns for your doctor, please call our main line at [phone] and press option 4 to reach your doctor's medical assistant. If no one answers, please leave a voicemail as directed and we will return your call as soon as possible. Messages left after 4 pm will be answered the following business day.   You may also send [place] a message via MyChart. We typically respond to MyChart messages within 1-2 business days.  For prescription refills, please ask your pharmacy to contact our office. Our fax number is [phone].  If you have an urgent issue when the clinic is closed that cannot wait until the next business day, you can page your doctor at the number below.    Please note that while we do our best to be available for urgent issues outside of office hours, we are not available 24/7.   If you have an urgent issue and are unable to reach [place], you may choose to seek medical care at your doctor's office, retail clinic, urgent care center, or emergency room.  If you have a medical emergency, please immediately call 911 or go to the emergency department.  Pager Numbers  - Dr. Gwen Pounds: [phone]  - Dr. Neale Burly: [phone]  - Dr. Roseanne Reno: [phone]  In the event of inclement weather, please call our main line at [phone] for an update on the status of any delays or closures.  Dermatology Medication Tips: Please keep the boxes that topical medications come in in order to help keep track of the instructions about where and how to use these. Pharmacies typically print the medication instructions only on the boxes and not directly on the medication tubes.   If your medication is too expensive, please contact our office at [phone] option 4 or send [place] a message through MyChart.   We are unable to tell what your co-pay for medications will be in advance as  this is different depending on your insurance coverage. However, we may be able to find a substitute medication at lower cost or fill out paperwork to get insurance to cover a needed medication.   If a prior authorization is required to get your medication covered by your insurance company, please allow [place] 1-2 business days to complete this process.  Drug prices often vary depending on where the prescription is filled and some pharmacies may offer cheaper prices.  The website www.goodrx.com contains coupons for medications through different pharmacies. The prices here do not account for what the cost may be with help from insurance (it may be cheaper with your insurance), but the website can give you the price if you did not use any insurance.  - You can print the associated coupon and take it with your prescription to the pharmacy.  - You may also stop by our office during regular business hours and pick up a GoodRx coupon card.  - If you need your prescription sent electronically to a different pharmacy, notify our office through [facility] or by phone at [phone] option 4.     Si Usted Necesita Algo Despus de Su Visita  Tambin puede enviarnos un mensaje a travs de Clinical cytogeneticist. Por lo general respondemos a los mensajes de MyChart en el transcurso de 1 a 2 das hbiles.  Para renovar recetas, por favor pida a su farmacia que se ponga en contacto con  nuestra oficina. Annie Sable de fax es [place] [phone].  Si tiene un asunto urgente cuando la clnica est cerrada y que no puede esperar hasta el siguiente da hbil, puede llamar/localizar a su doctor(a) al nmero que aparece a continuacin.   Por favor, tenga en cuenta que aunque hacemos todo lo posible para estar disponibles para asuntos urgentes fuera del horario de [place], no estamos disponibles las 24 horas del da, los 7 [address]  [address] de la [place].   Si tiene un problema urgente y no puede comunicarse con nosotros, puede optar por  buscar atencin mdica  en el consultorio de su doctor(a), en una clnica privada, en un centro de atencin urgente o en una sala de emergencias.  Si tiene Engineer, drilling, por favor llame inmediatamente al 911 o vaya a la sala de emergencias.  Nmeros de bper  - Dr. Gwen Pounds: [phone]  - Dra. Moye: [phone]  - Dra. Roseanne Reno: [phone]  En caso de inclemencias del [place], por favor llame a Lacy Duverney principal al [phone] para una actualizacin sobre el [place] de cualquier retraso o cierre.  Consejos para la medicacin en dermatologa: Por favor, guarde las cajas en las que vienen los medicamentos de uso tpico para ayudarle a seguir las instrucciones sobre dnde y cmo usarlos. Las farmacias generalmente imprimen las instrucciones del medicamento slo en las cajas y no directamente en los tubos del [place].   Si su medicamento es muy caro, por favor, pngase en contacto con Rolm Gala llamando al [phone] y presione la opcin 4 o envenos un mensaje a travs de Clinical cytogeneticist.   No podemos decirle cul ser su copago por los medicamentos por adelantado ya que esto es diferente dependiendo de la cobertura de su seguro. Sin embargo, es posible que podamos encontrar un medicamento sustituto a Audiological scientist un formulario para que el seguro cubra el medicamento que se considera necesario.   Si se requiere una autorizacin previa para que su compaa de seguros [place] su medicamento, por favor permtanos de 1 a 2 das hbiles para completar [address]  Los precios de los medicamentos varan con frecuencia dependiendo del Environmental consultant de dnde se surte la receta y alguna farmacias pueden ofrecer precios ms baratos.  El sitio web www.goodrx.com tiene cupones para medicamentos de Health and safety inspector. Los precios aqu no tienen en cuenta lo que podra costar con la ayuda del seguro (puede ser ms barato con su seguro), pero el sitio web puede darle el precio si no  utiliz Tourist information centre manager.  - Puede imprimir el cupn correspondiente y llevarlo con su receta a la farmacia.  - Tambin puede pasar por nuestra oficina durante el horario de atencin regular y Education officer, museum una tarjeta de cupones de GoodRx.  - Si necesita que su receta se enve electrnicamente a una farmacia diferente, informe a nuestra oficina a travs de MyChart de [place] o por telfono llamando al [phone] y presione la opcin 4.

## 2021-03-15 ENCOUNTER — Other Ambulatory Visit: Payer: Self-pay | Admitting: Dermatology

## 2021-03-20 ENCOUNTER — Other Ambulatory Visit: Payer: Self-pay | Admitting: Dermatology

## 2021-03-21 DIAGNOSIS — Z419 Encounter for procedure for purposes other than remedying health state, unspecified: Secondary | ICD-10-CM | POA: Diagnosis not present

## 2021-04-12 DIAGNOSIS — Z1389 Encounter for screening for other disorder: Secondary | ICD-10-CM | POA: Diagnosis not present

## 2021-04-16 ENCOUNTER — Ambulatory Visit (INDEPENDENT_AMBULATORY_CARE_PROVIDER_SITE_OTHER): Payer: Medicaid Other | Admitting: Dermatology

## 2021-04-16 ENCOUNTER — Other Ambulatory Visit: Payer: Self-pay

## 2021-04-16 DIAGNOSIS — L409 Psoriasis, unspecified: Secondary | ICD-10-CM

## 2021-04-16 DIAGNOSIS — L408 Other psoriasis: Secondary | ICD-10-CM | POA: Diagnosis not present

## 2021-04-16 NOTE — Patient Instructions (Signed)
Side effects of Otezla (apremilast) include diarrhea, nausea, headache, upper respiratory infection, depression, and weight decrease (5-10%). It should only be taken by pregnant women after a discussion regarding risks and benefits with their doctor. Goal is control of skin condition, not cure.  The use of Otezla requires long term medication management, including periodic office visits. ? ? ? ? ?If You Need Anything After Your Visit ? ?If you have any questions or concerns for your doctor, please call our main line at [phone] and press option 4 to reach your doctor's medical assistant. If no one answers, please leave a voicemail as directed and we will return your call as soon as possible. Messages left after 4 pm will be answered the following business day.  ? ?You may also send us a message via MyChart. We typically respond to MyChart messages within 1-2 business days. ? ?For prescription refills, please ask your pharmacy to contact our office. Our fax number is [phone]. ? ?If you have an urgent issue when the clinic is closed that cannot wait until the next business day, you can page your doctor at the number below.   ? ?Please note that while we do our best to be available for urgent issues outside of office hours, we are not available 24/7.  ? ?If you have an urgent issue and are unable to reach us, you may choose to seek medical care at your doctor's office, retail clinic, urgent care center, or emergency room. ? ?If you have a medical emergency, please immediately call 911 or go to the emergency department. ? ?Pager Numbers ? ?- Dr. Kowalski: [phone] ? ?- Dr. Moye: [phone] ? ?- Dr. Stewart: [phone] ? ?In the event of inclement weather, please call our main line at [phone] for an update on the status of any delays or closures. ? ?Dermatology Medication Tips: ?Please keep the boxes that topical medications come in in order to help keep track of the instructions about where and how  to use these. Pharmacies typically print the medication instructions only on the boxes and not directly on the medication tubes.  ? ?If your medication is too expensive, please contact our office at [phone] option 4 or send us a message through MyChart.  ? ?We are unable to tell what your co-pay for medications will be in advance as this is different depending on your insurance coverage. However, we may be able to find a substitute medication at lower cost or fill out paperwork to get insurance to cover a needed medication.  ? ?If a prior authorization is required to get your medication covered by your insurance company, please allow us 1-2 business days to complete this process. ? ?Drug prices often vary depending on where the prescription is filled and some pharmacies may offer cheaper prices. ? ?The website www.goodrx.com contains coupons for medications through different pharmacies. The prices here do not account for what the cost may be with help from insurance (it may be cheaper with your insurance), but the website can give you the price if you did not use any insurance.  ?- You can print the associated coupon and take it with your prescription to the pharmacy.  ?- You may also stop by our office during regular business hours and pick up a GoodRx coupon card.  ?- If you need your prescription sent electronically to a different pharmacy, notify our office through [facility] MyChart or by phone at [phone] option 4. ? ? ? ? ?Si Usted Necesita   Algo Despu?s de Su Visita ? ?Tambi?n puede enviarnos un mensaje a trav?s de MyChart. Por lo general respondemos a los mensajes de MyChart en el transcurso de 1 a 2 d?as h?biles. ? ?Para renovar recetas, por favor pida a su farmacia que se ponga en contacto con nuestra oficina. Nuestro n?mero de fax es el [phone]. ? ?Si tiene un asunto urgente cuando la cl?nica est? cerrada y que no puede esperar hasta el siguiente d?a h?bil, puede llamar/localizar a su  doctor(a) al n?mero que aparece a continuaci?n.  ? ?Por favor, tenga en cuenta que aunque hacemos todo lo posible para estar disponibles para asuntos urgentes fuera del horario de oficina, no estamos disponibles las 24 horas del d?a, los 7 d?as de la semana.  ? ?Si tiene un problema urgente y no puede comunicarse con nosotros, puede optar por buscar atenci?n m?dica  en el consultorio de su doctor(a), en una cl?nica privada, en un centro de atenci?n urgente o en una sala de emergencias. ? ?Si tiene una emergencia m?dica, por favor llame inmediatamente al 911 o vaya a la sala de emergencias. ? ?N?meros de b?per ? ?- Dr. Kowalski: [phone] ? ?- Dra. Moye: [phone] ? ?- Dra. Stewart: [phone] ? ?En caso de inclemencias del tiempo, por favor llame a nuestra l?nea principal al [phone] para una actualizaci?n sobre el estado de cualquier retraso o cierre. ? ?Consejos para la medicaci?n en dermatolog?a: ?Por favor, guarde las cajas en las que vienen los medicamentos de uso t?pico para ayudarle a seguir las instrucciones sobre d?nde y c?mo usarlos. Las farmacias generalmente imprimen las instrucciones del medicamento s?lo en las cajas y no directamente en los tubos del medicamento.  ? ?Si su medicamento es muy caro, por favor, p?ngase en contacto con nuestra oficina llamando al [phone] y presione la opci?n 4 o env?enos un mensaje a trav?s de MyChart.  ? ?No podemos decirle cu?l ser? su copago por los medicamentos por adelantado ya que esto es diferente dependiendo de la cobertura de su seguro. Sin embargo, es posible que podamos encontrar un medicamento sustituto a menor costo o llenar un formulario para que el seguro cubra el medicamento que se considera necesario.  ? ?Si se requiere una autorizaci?n previa para que su compa??a de seguros cubra su medicamento, por favor perm?tanos de 1 a 2 d?as h?biles para completar este proceso. ? ?Los precios de los medicamentos var?an con frecuencia dependiendo del  lugar de d?nde se surte la receta y alguna farmacias pueden ofrecer precios m?s baratos. ? ?El sitio web www.goodrx.com tiene cupones para medicamentos de diferentes farmacias. Los precios aqu? no tienen en cuenta lo que podr?a costar con la ayuda del seguro (puede ser m?s barato con su seguro), pero el sitio web puede darle el precio si no utiliz? ning?n seguro.  ?- Puede imprimir el cup?n correspondiente y llevarlo con su receta a la farmacia.  ?- Tambi?n puede pasar por nuestra oficina durante el horario de atenci?n regular y recoger una tarjeta de cupones de GoodRx.  ?- Si necesita que su receta se env?e electr?nicamente a una farmacia diferente, informe a nuestra oficina a trav?s de MyChart de [place] o por tel?fono llamando al [phone] y presione la opci?n 4.  ?

## 2021-04-16 NOTE — Progress Notes (Signed)
? ?  Follow-Up Visit ?  ?Subjective  ?April Chang is a 60 y.o. female who presents for the following: Psoriasis (Sister has been giving her Otezla BID QOD since her insurance denied it and she has been trying to make it to this appointment. Sister states that psoriasis has significantly improved since starting [place]. Pt has had some GI upsets but started an antidepressant at the same time as the [place] and isn't sure which one is causing GI upsets. Pt is currently using a topical cream for her psoriasis but is unsure what the name of it is and will contact our office later today to report the name. ). 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 ?The patient's sister is present with her and contributes to history. ? ?The following portions of the chart were reviewed this encounter and updated as appropriate:  ? Tobacco  Allergies  Meds  Problems  Med Hx  Surg Hx  Fam Hx   ?  ?Review of Systems:  No other skin or systemic complaints except as noted in HPI or Assessment and Plan. ? ?Objective  ?Well appearing patient in no apparent distress; mood and affect are within normal limits. ? ?A focused examination was performed including the scalp. Relevant physical exam findings are noted in the Assessment and Plan. ? ?Axillary, inframammary, groin, scalp ?Some macule pinkness inframammary and axillary improved from photos.  ? ? ? ? ? ? ? ?Assessment & Plan  ?Psoriasis ?Axillary, inframammary, groin, scalp ? ?With psoriasis inversa, photos today were taken before and after starting treatment -  ? ?Chronic and persistent condition with duration or expected duration over one year. Condition is symptomatic / bothersome to patient. Not to goal.  Improved on Otezla. ?Psoriasis is a chronic non-curable, but treatable genetic/hereditary disease that may have other systemic features affecting other organ systems such as joints (Psoriatic Arthritis). It is associated with an increased risk of  inflammatory bowel disease, heart disease, non-alcoholic fatty liver disease, and depression.   ? ?Due to s/e of upset stomach decrease Otezla to 30mg  po QD. If still having upset in stomach 3-4 weeks after stopping anti depressant consider stopping as it may be [place] causing GI issues.  ? ?Continue topical cream for psoriasis - pt's sister believes it may be Vtama and will contact our office with the name of the topical cream she's using.  ? ?Related Medications ?Tapinarof (VTAMA) 1 % CREA ?Apply 1 application topically daily. To affected areas of psoriasis ? ?Return in about 3 months (around 07/17/2021) for psoriasis follow up . ? ?I6/29/2023, CMA, am acting as scribe for Cari Caraway, MD . ?Documentation: I have reviewed the above documentation for accuracy and completeness, and I agree with the above. ? ?Armida Sans, MD ? ? ?

## 2021-04-17 ENCOUNTER — Telehealth: Payer: Self-pay

## 2021-04-17 ENCOUNTER — Encounter: Payer: Self-pay | Admitting: Dermatology

## 2021-04-17 NOTE — Telephone Encounter (Signed)
Patient's sister contacted our office yesterday after her visit to let us know that the topical treatment that she is using for psoriasis in [place].  ?

## 2021-04-17 NOTE — Telephone Encounter (Signed)
Unable to leave a message. One sheet was missing a signature on the [employer] patient assistance form for [place]. If she could bring April Chang up here to sign the last page we can fax it in. If she's unable to make it we could also mail it to her and she could mail it back.  ?

## 2021-04-21 DIAGNOSIS — Z419 Encounter for procedure for purposes other than remedying health state, unspecified: Secondary | ICD-10-CM | POA: Diagnosis not present

## 2021-05-08 DIAGNOSIS — F39 Unspecified mood [affective] disorder: Secondary | ICD-10-CM | POA: Diagnosis not present

## 2021-05-08 DIAGNOSIS — Z0130 Encounter for examination of blood pressure without abnormal findings: Secondary | ICD-10-CM | POA: Diagnosis not present

## 2021-05-08 DIAGNOSIS — M545 Low back pain, unspecified: Secondary | ICD-10-CM | POA: Diagnosis not present

## 2021-05-15 ENCOUNTER — Other Ambulatory Visit: Payer: Self-pay | Admitting: Student

## 2021-05-15 DIAGNOSIS — Z1231 Encounter for screening mammogram for malignant neoplasm of breast: Secondary | ICD-10-CM

## 2021-05-21 DIAGNOSIS — Z419 Encounter for procedure for purposes other than remedying health state, unspecified: Secondary | ICD-10-CM | POA: Diagnosis not present

## 2021-05-30 ENCOUNTER — Emergency Department
Admission: EM | Admit: 2021-05-30 | Discharge: 2021-05-30 | Disposition: A | Discharge status: Home / Self Care | Payer: Medicaid Other | Attending: Emergency Medicine | Admitting: Emergency Medicine

## 2021-05-30 ENCOUNTER — Other Ambulatory Visit: Payer: Self-pay

## 2021-05-30 ENCOUNTER — Encounter: Payer: Self-pay | Admitting: Emergency Medicine

## 2021-05-30 DIAGNOSIS — E860 Dehydration: Secondary | ICD-10-CM | POA: Diagnosis not present

## 2021-05-30 DIAGNOSIS — I10 Essential (primary) hypertension: Secondary | ICD-10-CM | POA: Diagnosis not present

## 2021-05-30 DIAGNOSIS — K921 Melena: Secondary | ICD-10-CM | POA: Diagnosis not present

## 2021-05-30 DIAGNOSIS — R531 Weakness: Secondary | ICD-10-CM | POA: Diagnosis not present

## 2021-05-30 DIAGNOSIS — Z743 Need for continuous supervision: Secondary | ICD-10-CM | POA: Diagnosis not present

## 2021-05-30 DIAGNOSIS — R42 Dizziness and giddiness: Secondary | ICD-10-CM | POA: Diagnosis not present

## 2021-05-30 DIAGNOSIS — R55 Syncope and collapse: Secondary | ICD-10-CM | POA: Diagnosis not present

## 2021-05-30 DIAGNOSIS — R195 Other fecal abnormalities: Secondary | ICD-10-CM

## 2021-05-30 DIAGNOSIS — R000 Tachycardia, unspecified: Secondary | ICD-10-CM | POA: Diagnosis not present

## 2021-05-30 DIAGNOSIS — R197 Diarrhea, unspecified: Secondary | ICD-10-CM | POA: Insufficient documentation

## 2021-05-30 LAB — URINALYSIS, ROUTINE W REFLEX MICROSCOPIC
Bilirubin Urine: NEGATIVE
Glucose, UA: NEGATIVE mg/dL
Hgb urine dipstick: NEGATIVE
Ketones, ur: NEGATIVE mg/dL
Nitrite: NEGATIVE
Protein, ur: NEGATIVE mg/dL
Specific Gravity, Urine: 1.012 (ref 1.005–1.030)
pH: 6 (ref 5.0–8.0)

## 2021-05-30 LAB — COMPREHENSIVE METABOLIC PANEL
ALT: 7 U/L (ref 0–44)
AST: 23 U/L (ref 15–41)
Albumin: 3.5 g/dL (ref 3.5–5.0)
Alkaline Phosphatase: 78 U/L (ref 38–126)
Anion gap: 6 (ref 5–15)
BUN: 16 mg/dL (ref 6–20)
CO2: 24 mmol/L (ref 22–32)
Calcium: 8.7 mg/dL — ABNORMAL LOW (ref 8.9–10.3)
Chloride: 106 mmol/L (ref 98–111)
Creatinine, Ser: 0.77 mg/dL (ref 0.44–1.00)
GFR, Estimated: >60 mL/min (ref >60)
Glucose, Bld: 106 mg/dL — ABNORMAL HIGH (ref 70–99)
Potassium: 4.4 mmol/L (ref 3.5–5.1)
Sodium: 136 mmol/L (ref 135–145)
Total Bilirubin: 0.8 mg/dL (ref 0.3–1.2)
Total Protein: 6.9 g/dL (ref 6.5–8.1)

## 2021-05-30 LAB — CBC
HCT: 41.2 % (ref 36.0–46.0)
Hemoglobin: 13.4 g/dL (ref 12.0–15.0)
MCH: 30.1 pg (ref 26.0–34.0)
MCHC: 32.5 g/dL (ref 30.0–36.0)
MCV: 92.6 fL (ref 80.0–100.0)
Platelets: 174 10*3/uL (ref 150–400)
RBC: 4.45 MIL/uL (ref 3.87–5.11)
RDW: 12.7 % (ref 11.5–15.5)
WBC: 6.7 10*3/uL (ref 4.0–10.5)
nRBC: 0 % (ref 0.0–0.2)

## 2021-05-30 LAB — LIPASE, BLOOD: Lipase: 32 U/L (ref 11–51)

## 2021-05-30 MED ORDER — SODIUM CHLORIDE 0.9 % IV BOLUS
1000.0000 mL | Freq: Once | INTRAVENOUS | Status: AC
  Administered 2021-05-30: 1000 mL via INTRAVENOUS
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
## 2021-05-30 NOTE — ED Provider Notes (Signed)
? ?[facility] ?Provider Note ? ? ? Event Date/Time  ? First MD Initiated Contact with Patient 05/30/21 [phone]   ?  (approximate) ? ? ?History  ? ?Loss of Consciousness ? ?EM caveat: Patient with memory deficits due to previous traumatic brain injury ? ?History is obtained from EMS who have also communicated with the patient's guardian who they reports her mother -reportedly in route to the hospital as well ? ?HPI ? ?April Chang is a 61 y.o. female who on review of office note from Dr. Gwen PoundsKowalski from March 27 of this year has a history of psoriasis ? ?Also noted on review of records has a history of [facility] and hypertension ?  ?Patient has had several days of loose stool.  Today while using the toilet she had a episode where she passed out.  No noted injuries or report of injury per EMS ? ?Patient reports no pain or discomfort.  Patient reports she knows she is at a hospital not sure why.  Reports her mother helps care for her ? ?She denies any headache neck pain chest pain abdominal pain or other concerns or symptoms.  She does reports she is somewhat thirsty ? ?Patient's sister arrives, advises that she is the patient's legal guardian.  She was with the patient, patient has had loose stools for about a week that they believe started when she started taking medication for her psoriasis.  Was to see primary care this morning for same.  No recent antibiotics no fever she has not complained of pain or any vomiting.  We will have 1 or 2 loose bowel movements daily and poor oral intake, which the poor oral intake is not new for her. ? ?While in the shower patient started to appear pale, felt lightheaded and sister assisted her to the ground and as she did so the patient briefly passed out.  She did urinate on herself during that time but no seizure-like activity and awoke on the floor thereafter.  There was no fall or injury, was lowered to the ground  by sister. ? ? ? ?Physical Exam  ? ?Triage Vital Signs: ?ED Triage Vitals  ?Enc Vitals Group  ?   BP 05/30/21 0821 136/85  ?   Pulse Rate 05/30/21 0821 81  ?   Resp 05/30/21 0821 18  ?   Temp 05/30/21 0821 97.9 ?F (36.6 ?C)  ?   Temp Source 05/30/21 0821 Oral  ?   SpO2 05/30/21 0818 96 %  ?   Weight 05/30/21 0820 170 lb (77.1 kg)  ?   Height 05/30/21 0820 5\' 7"  (1.702 m)  ?   Head Circumference --   ?   Peak Flow --   ?   Pain Score 05/30/21 0820 0  ?   Pain Loc --   ?   Pain Edu? --   ?   Excl. in GC? --   ? ? ?Most recent vital signs: ?Vitals:  ? 05/30/21 1045 05/30/21 1215  ?BP: 134/90 121/81  ?Pulse: 71 69  ?Resp: 15 17  ?Temp:    ?SpO2: 100% 100%  ? ? ? ?General: Awake, no distress.  Atraumatic.  No cervical tenderness.  Oriented to self and sister but not to year, but oriented to being at the hospital.  Normal mental baseline per the patient's sister ?CV:  Good peripheral perfusion.  Heart tones are normal without murmurs or rubs ?Resp:  Normal effort.  Clear lungs  bilaterally no distress.  Speaks full freely without difficulty ?Abd:  No distention.  Soft nontender nondistended through all quadrants ?Other:  Warm and well-perfused moves extremities.  Slightly dry mucous membranes.  Moves all extremities well to command without deficits. ? ?Patient appears nontoxic without distress on exam. ? ? ?ED Results / Procedures / Treatments  ? ?Labs ?(all labs ordered are listed, but only abnormal results are displayed) ?Labs Reviewed  ?COMPREHENSIVE METABOLIC PANEL - Abnormal; Notable for the following components:  ?    Result Value  ? Glucose, Bld 106 (*)   ? Calcium 8.7 (*)   ? All other components within normal limits  ?URINALYSIS, ROUTINE W REFLEX MICROSCOPIC - Abnormal; Notable for the following components:  ? Color, Urine YELLOW (*)   ? APPearance CLEAR (*)   ? Leukocytes,Ua TRACE (*)   ? Bacteria, UA RARE (*)   ? All other components within normal limits  ?GASTROINTESTINAL PANEL BY PCR, STOOL (REPLACES STOOL  CULTURE)  ?C DIFFICILE QUICK SCREEN W PCR REFLEX    ?URINE CULTURE  ?CBC  ?LIPASE, BLOOD  ? ? ? ?EKG ? ?EKG reviewed by me at 825 ?Heart rate 80 ?QRS 95 ?QTc 479 normal sinus rhythm no evidence of ischemia or ectopy ? ? ?RADIOLOGY ? ?No noted indication for CT imaging of the head.  No trauma.  Patient at normal baseline mental status ? ? ? ? ? ?PROCEDURES: ? ?Critical Care performed: No ? ?Procedures ? ? ?MEDICATIONS ORDERED IN ED: ?Medications  ?sodium chloride 0.9 % bolus 1,000 mL (0 mLs Intravenous Stopped 05/30/21 1104)  ? ? ? ?IMPRESSION / MDM / ASSESSMENT AND PLAN / ED COURSE  ?I reviewed the triage vital signs and the nursing notes. ?             ?               ? ?Differential diagnosis includes, but is not limited to, possible vasovagal episode, dehydration, colitis or loose stool/diarrheal illness ongoing for a week.  No associate abdominal pain or fevers.  She does not have any associated chest pain neurologic symptoms or vascular abnormalities or respiratory symptoms.  Her vital signs are normal. ? ?She is currently at her mental baseline.  She did have a syncopal episode however, given the clinical history this occurring while standing in the shower my first thought is that she is likely dehydrated secondary to loose stools.  We will attempt to collect stool culture, but may not be able to collect as she had a bowel movement already today. ? ?No fevers no travel no obvious symptoms or pain or evidence to suggest acute intra-abdominal infection.  May be medication side effect sister reports that this started the same time she started oral psoriasis medication about a month and a half ago through dermatology, and has had off-and-on bouts of loose stools. ? ? ? ?The patient is on the cardiac monitor to evaluate for evidence of arrhythmia and/or significant heart rate changes. ? ?Clinical Course as of 05/30/21 1250  ?Wed May 30, 2021  ?1024 Labs reviewed, CBC and metabolic panel unremarkable.  No acute  abnormalities. [MQ]  ?  ?Clinical Course User Index ?[MQ] Sharyn Creamer, MD  ? ?----------------------------------------- ?12:46 PM on 05/30/2021 ?----------------------------------------- ?Patient feels well, has been up ambulating in the room without difficulty.  Normotensive.  Appears well, patient's guardian at bedside who is her sister, advises they feel comfortable with plan to discharge and follow-up with primary care.  Feels  that she was dehydrated and feels she is doing well now.  I am in agreement.  Patient appears quite well without complaint or concern.  Resting normotensive since been able to ambulate without difficulty and appears well.  Syncope felt likely secondary to vasovagal in the setting of possible dehydration.  No further stools or diarrhea here, they will attempt to collect and submit outpatient ? ?FINAL CLINICAL IMPRESSION(S) / ED DIAGNOSES  ? ?Final diagnoses:  ?Syncope and collapse  ?Dehydration  ?Loose stools  ? ? ? ?Rx / DC Orders  ? ?ED Discharge Orders   ? ?      Ordered  ?  Stool culture       ? 05/30/21 0909  ?  Clostridium difficile,EIA       ? 05/30/21 0909  ? ?  ?  ? ?  ? ? ? ?Note:  This document was prepared using Dragon voice recognition software and may include unintentional dictation errors. ?  Sharyn Creamer, MD ?05/30/21 1250 ? ?

## 2021-05-30 NOTE — ED Notes (Signed)
Patient given water at this time per Fanny Bien, MD. ?

## 2021-05-30 NOTE — Discharge Instructions (Signed)
Please submit stool studies to the [facility] lab when able to collect at home. ? ?Follow-up closely with your primary care doctor, call today to reschedule your appointment (as you missed today's) ?

## 2021-05-31 ENCOUNTER — Other Ambulatory Visit: Payer: Self-pay | Admitting: Dermatology

## 2021-05-31 ENCOUNTER — Telehealth: Payer: Self-pay

## 2021-05-31 LAB — URINE CULTURE: Culture: NO GROWTH

## 2021-05-31 NOTE — Telephone Encounter (Signed)
Transition Care Management Follow-up Telephone Call ?Date of discharge and from where: 05/30/2021-[facility] ?How have you been since you were released from the hospital? Pt still has some weakness but will follow up with community health per Barbaraann Share (Guardian/caregiver) ?Any questions or concerns? No ? ?Items Reviewed: ?Did the pt receive and understand the discharge instructions provided? Yes  ?Medications obtained and verified?  No new medications given at discharge  ?Other? No  ?Any new allergies since your discharge? No  ?Dietary orders reviewed? No ?Do you have support at home? Yes  ? ?Home Care and Equipment/Supplies: ?Were home health services ordered? no ?If so, what is the name of the agency? N/A  ?Has the agency set up a time to come to the patient's home? not applicable ?Were any new equipment or medical supplies ordered?  No ?What is the name of the medical supply agency? N/A ?Were you able to get the supplies/equipment? not applicable ?Do you have any questions related to the use of the equipment or supplies? No ? ?Functional Questionnaire: (I = Independent and D = Dependent) ?ADLs: D ? ?Bathing/Dressing- D ? ?Meal Prep- D ? ?Eating- D ? ?Maintaining continence- D ? ?Transferring/Ambulation- D ? ?Managing Meds- D ? ?Follow up appointments reviewed: ? ?PCP Hospital f/u appt confirmed? No   ?Specialist Hospital f/u appt confirmed? No   ?Are transportation arrangements needed? No  ?If their condition worsens, is the pt aware to call PCP or go to the Emergency Dept.? Yes ?Was the patient provided with contact information for the PCP's office or ED? Yes ?Was to pt encouraged to call back with questions or concerns? Yes  ?

## 2021-06-01 DIAGNOSIS — A084 Viral intestinal infection, unspecified: Secondary | ICD-10-CM | POA: Diagnosis not present

## 2021-06-01 DIAGNOSIS — Z1389 Encounter for screening for other disorder: Secondary | ICD-10-CM | POA: Diagnosis not present

## 2021-06-01 DIAGNOSIS — Z0130 Encounter for examination of blood pressure without abnormal findings: Secondary | ICD-10-CM | POA: Diagnosis not present

## 2021-06-11 DIAGNOSIS — Z1389 Encounter for screening for other disorder: Secondary | ICD-10-CM | POA: Diagnosis not present

## 2021-06-11 DIAGNOSIS — R2243 Localized swelling, mass and lump, lower limb, bilateral: Secondary | ICD-10-CM | POA: Diagnosis not present

## 2021-06-11 DIAGNOSIS — Z0130 Encounter for examination of blood pressure without abnormal findings: Secondary | ICD-10-CM | POA: Diagnosis not present

## 2021-06-20 ENCOUNTER — Ambulatory Visit
Admission: RE | Admit: 2021-06-20 | Discharge: 2021-06-20 | Disposition: A | Discharge status: Home / Self Care | Payer: Medicaid Other | Source: Ambulatory Visit | Attending: Student | Admitting: Student

## 2021-06-20 DIAGNOSIS — Z1231 Encounter for screening mammogram for malignant neoplasm of breast: Secondary | ICD-10-CM | POA: Insufficient documentation

## 2021-06-21 DIAGNOSIS — Z419 Encounter for procedure for purposes other than remedying health state, unspecified: Secondary | ICD-10-CM | POA: Diagnosis not present

## 2021-06-30 ENCOUNTER — Other Ambulatory Visit: Payer: Self-pay | Admitting: Dermatology

## 2021-06-30 DIAGNOSIS — L409 Psoriasis, unspecified: Secondary | ICD-10-CM

## 2021-07-11 ENCOUNTER — Other Ambulatory Visit: Payer: Self-pay | Admitting: Student

## 2021-07-11 DIAGNOSIS — Z0130 Encounter for examination of blood pressure without abnormal findings: Secondary | ICD-10-CM | POA: Diagnosis not present

## 2021-07-11 DIAGNOSIS — R0609 Other forms of dyspnea: Secondary | ICD-10-CM | POA: Diagnosis not present

## 2021-07-11 DIAGNOSIS — Z1389 Encounter for screening for other disorder: Secondary | ICD-10-CM | POA: Diagnosis not present

## 2021-07-11 DIAGNOSIS — Z0131 Encounter for examination of blood pressure with abnormal findings: Secondary | ICD-10-CM | POA: Diagnosis not present

## 2021-07-11 DIAGNOSIS — R053 Chronic cough: Secondary | ICD-10-CM

## 2021-07-13 ENCOUNTER — Other Ambulatory Visit: Payer: Self-pay | Admitting: Dermatology

## 2021-07-13 ENCOUNTER — Telehealth: Payer: Self-pay

## 2021-07-13 NOTE — Telephone Encounter (Signed)
Per Kendal Hymen (pt's legal guardian), pt sees a PCP at [facility], Dr Laymond Purser is pt's PCP.

## 2021-07-19 ENCOUNTER — Encounter: Payer: Self-pay | Admitting: *Deleted

## 2021-07-19 ENCOUNTER — Ambulatory Visit
Admission: RE | Admit: 2021-07-19 | Discharge: 2021-07-19 | Disposition: A | Discharge status: Home / Self Care | Payer: Medicaid Other | Source: Ambulatory Visit | Attending: Student | Admitting: Student

## 2021-07-19 DIAGNOSIS — R053 Chronic cough: Secondary | ICD-10-CM | POA: Insufficient documentation

## 2021-07-19 DIAGNOSIS — E041 Nontoxic single thyroid nodule: Secondary | ICD-10-CM | POA: Diagnosis not present

## 2021-07-21 DIAGNOSIS — Z419 Encounter for procedure for purposes other than remedying health state, unspecified: Secondary | ICD-10-CM | POA: Diagnosis not present

## 2021-07-27 ENCOUNTER — Ambulatory Visit (INDEPENDENT_AMBULATORY_CARE_PROVIDER_SITE_OTHER): Payer: Medicaid Other | Admitting: Internal Medicine

## 2021-07-27 ENCOUNTER — Other Ambulatory Visit: Payer: Self-pay | Admitting: Internal Medicine

## 2021-07-27 ENCOUNTER — Other Ambulatory Visit
Admission: RE | Admit: 2021-07-27 | Discharge: 2021-07-27 | Disposition: A | Discharge status: Home / Self Care | Payer: Medicaid Other | Attending: Internal Medicine | Admitting: Internal Medicine

## 2021-07-27 ENCOUNTER — Encounter: Payer: Self-pay | Admitting: Internal Medicine

## 2021-07-27 VITALS — BP 110/80 | HR 94 | Ht 62.0 in | Wt 164.0 lb

## 2021-07-27 DIAGNOSIS — M7989 Other specified soft tissue disorders: Secondary | ICD-10-CM

## 2021-07-27 DIAGNOSIS — R0602 Shortness of breath: Secondary | ICD-10-CM

## 2021-07-27 DIAGNOSIS — I509 Heart failure, unspecified: Secondary | ICD-10-CM | POA: Insufficient documentation

## 2021-07-27 DIAGNOSIS — I10 Essential (primary) hypertension: Secondary | ICD-10-CM | POA: Diagnosis not present

## 2021-07-27 LAB — CBC
HCT: 41.2 % (ref 36.0–46.0)
Hemoglobin: 13.6 g/dL (ref 12.0–15.0)
MCH: 30.4 pg (ref 26.0–34.0)
MCHC: 33 g/dL (ref 30.0–36.0)
MCV: 92.2 fL (ref 80.0–100.0)
Platelets: 199 10*3/uL (ref 150–400)
RBC: 4.47 MIL/uL (ref 3.87–5.11)
RDW: 12.9 % (ref 11.5–15.5)
WBC: 7 10*3/uL (ref 4.0–10.5)
nRBC: 0 % (ref 0.0–0.2)

## 2021-07-27 LAB — COMPREHENSIVE METABOLIC PANEL
ALT: 14 U/L (ref 0–44)
AST: 17 U/L (ref 15–41)
Albumin: 3.8 g/dL (ref 3.5–5.0)
Alkaline Phosphatase: 92 U/L (ref 38–126)
Anion gap: 8 (ref 5–15)
BUN: 17 mg/dL (ref 6–20)
CO2: 27 mmol/L (ref 22–32)
Calcium: 9 mg/dL (ref 8.9–10.3)
Chloride: 105 mmol/L (ref 98–111)
Creatinine, Ser: 0.75 mg/dL (ref 0.44–1.00)
GFR, Estimated: >60 mL/min (ref >60)
Glucose, Bld: 89 mg/dL (ref 70–99)
Potassium: 4 mmol/L (ref 3.5–5.1)
Sodium: 140 mmol/L (ref 135–145)
Total Bilirubin: 0.4 mg/dL (ref 0.3–1.2)
Total Protein: 7.3 g/dL (ref 6.5–8.1)

## 2021-07-27 LAB — TSH: TSH: 2.765 u[IU]/mL (ref 0.350–4.500)

## 2021-07-27 LAB — BRAIN NATRIURETIC PEPTIDE: B Natriuretic Peptide: 15.3 pg/mL (ref 0.0–100.0)

## 2021-07-27 MED ORDER — FUROSEMIDE 40 MG PO TABS: 40.0000 mg | ORAL_TABLET | Freq: Every day | ORAL | 0 refills | Status: DC

## 2021-07-27 NOTE — Patient Instructions (Addendum)
Medication Instructions:   Your physician has recommended you make the following change in your medication:   START Lasix (furosemide) 40mg  daily   *If you need a refill on your cardiac medications before your next appointment, please call your pharmacy*   Lab Work:  Today at the medical mall at [facility]: CBC, CMET, BNP, TSH  -  Please go to the Medical Mall Entrance at [facility] -  Check in at the Registration Desk: 1st desk to the right, past the screening table  If you have labs (blood work) drawn today and your tests are completely normal, you will receive your results only by: MyChart Message (if you have MyChart) OR A paper copy in the mail If you have any lab test that is abnormal or we need to change your treatment, we will call you to review the results.   Testing/Procedures:  Your physician has requested that you have an echocardiogram. Echocardiography is a painless test that uses sound waves to create images of your heart. It provides your doctor with information about the size and shape of your heart and how well your heart's chambers and valves are working. This procedure takes approximately one hour. There are no restrictions for this procedure.   Follow-Up: At [facility], you and your health needs are our priority.  As part of our continuing mission to provide you with exceptional heart care, we have created designated Provider Care Teams.  These Care Teams include your primary Cardiologist (physician) and Advanced Practice Providers (APPs -  Physician Assistants and Nurse Practitioners) who all work together to provide you with the care you need, when you need it.  We recommend signing up for the patient portal called "MyChart".  Sign up information is provided on this After Visit Summary.  MyChart is used to connect with patients for Virtual Visits (Telemedicine).  Patients are able to view lab/test results, encounter notes, upcoming appointments, etc.  Non-urgent messages  can be sent to your provider as well.   To learn more about what you can do with MyChart, go to [facility].    Your next appointment:    Follow up shortly after echo  The format for your next appointment:   In Person  Provider:   You may see Dr. [email or web address] or one of the following Advanced Practice Providers on your designated Care Team:   Cristal Deer, NP Nicolasa Ducking, PA-C Cadence Eula Listen, PA-C{   Important Information About Sugar
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
## 2021-07-29 ENCOUNTER — Encounter: Payer: Self-pay | Admitting: Internal Medicine

## 2021-07-30 ENCOUNTER — Ambulatory Visit (INDEPENDENT_AMBULATORY_CARE_PROVIDER_SITE_OTHER): Payer: Medicaid Other | Admitting: Dermatology

## 2021-07-30 DIAGNOSIS — L409 Psoriasis, unspecified: Secondary | ICD-10-CM

## 2021-07-30 MED ORDER — CLOBETASOL PROPIONATE 0.05 % EX SOLN: CUTANEOUS | 5 refills | Status: DC

## 2021-07-30 MED ORDER — VTAMA 1 % EX CREA: TOPICAL_CREAM | CUTANEOUS | 6 refills | Status: DC

## 2021-07-30 NOTE — Patient Instructions (Signed)
Side effects of Otezla (apremilast) include diarrhea, nausea, headache, upper respiratory infection, depression, and weight decrease (5-10%). It should only be taken by pregnant women after a discussion regarding risks and benefits with their doctor. Goal is control of skin condition, not cure.  The use of Otezla requires long term medication management, including periodic office visits.   Due to recent changes in healthcare laws, you may see results of your pathology and/or laboratory studies on MyChart before the doctors have had a chance to review them. We understand that in some cases there may be results that are confusing or concerning to you. Please understand that not all results are received at the same time and often the doctors may need to interpret multiple results in order to provide you with the best plan of care or course of treatment. Therefore, we ask that you please give us 2 business days to thoroughly review all your results before contacting the office for clarification. Should we see a critical lab result, you will be contacted sooner.   If You Need Anything After Your Visit  If you have any questions or concerns for your doctor, please call our main line at [phone] and press option 4 to reach your doctor's medical assistant. If no one answers, please leave a voicemail as directed and we will return your call as soon as possible. Messages left after 4 pm will be answered the following business day.   You may also send us a message via MyChart. We typically respond to MyChart messages within 1-2 business days.  For prescription refills, please ask your pharmacy to contact our office. Our fax number is [phone].  If you have an urgent issue when the clinic is closed that cannot wait until the next business day, you can page your doctor at the number below.    Please note that while we do our best to be available for urgent issues outside of office hours, we are not  available 24/7.   If you have an urgent issue and are unable to reach us, you may choose to seek medical care at your doctor's office, retail clinic, urgent care center, or emergency room.  If you have a medical emergency, please immediately call 911 or go to the emergency department.  Pager Numbers  - Dr. Kowalski: [phone]  - Dr. Moye: [phone]  - Dr. Stewart: [phone]  In the event of inclement weather, please call our main line at [phone] for an update on the status of any delays or closures.  Dermatology Medication Tips: Please keep the boxes that topical medications come in in order to help keep track of the instructions about where and how to use these. Pharmacies typically print the medication instructions only on the boxes and not directly on the medication tubes.   If your medication is too expensive, please contact our office at [phone] option 4 or send us a message through MyChart.   We are unable to tell what your co-pay for medications will be in advance as this is different depending on your insurance coverage. However, we may be able to find a substitute medication at lower cost or fill out paperwork to get insurance to cover a needed medication.   If a prior authorization is required to get your medication covered by your insurance company, please allow us 1-2 business days to complete this process.  Drug prices often vary depending on where the prescription is filled and some pharmacies may offer cheaper prices.  The   website www.goodrx.com contains coupons for medications through different pharmacies. The prices here do not account for what the cost may be with help from insurance (it may be cheaper with your insurance), but the website can give you the price if you did not use any insurance.  - You can print the associated coupon and take it with your prescription to the pharmacy.  - You may also stop by our office during regular business hours  and pick up a GoodRx coupon card.  - If you need your prescription sent electronically to a different pharmacy, notify our office through [facility] MyChart or by phone at [phone] option 4.     Si Usted Necesita Algo Despus de Su Visita  Tambin puede enviarnos un mensaje a travs de MyChart. Por lo general respondemos a los mensajes de MyChart en el transcurso de 1 a 2 das hbiles.  Para renovar recetas, por favor pida a su farmacia que se ponga en contacto con nuestra oficina. Nuestro nmero de fax es el [phone].  Si tiene un asunto urgente cuando la clnica est cerrada y que no puede esperar hasta el siguiente da hbil, puede llamar/localizar a su doctor(a) al nmero que aparece a continuacin.   Por favor, tenga en cuenta que aunque hacemos todo lo posible para estar disponibles para asuntos urgentes fuera del horario de oficina, no estamos disponibles las 24 horas del da, los 7 das de la semana.   Si tiene un problema urgente y no puede comunicarse con nosotros, puede optar por buscar atencin mdica  en el consultorio de su doctor(a), en una clnica privada, en un centro de atencin urgente o en una sala de emergencias.  Si tiene una emergencia mdica, por favor llame inmediatamente al 911 o vaya a la sala de emergencias.  Nmeros de bper  - Dr. Kowalski: [phone]  - Dra. Moye: [phone]  - Dra. Stewart: [phone]  En caso de inclemencias del tiempo, por favor llame a nuestra lnea principal al [phone] para una actualizacin sobre el estado de cualquier retraso o cierre.  Consejos para la medicacin en dermatologa: Por favor, guarde las cajas en las que vienen los medicamentos de uso tpico para ayudarle a seguir las instrucciones sobre dnde y cmo usarlos. Las farmacias generalmente imprimen las instrucciones del medicamento slo en las cajas y no directamente en los tubos del medicamento.   Si su medicamento es muy caro, por favor, pngase  en contacto con nuestra oficina llamando al [phone] y presione la opcin 4 o envenos un mensaje a travs de MyChart.   No podemos decirle cul ser su copago por los medicamentos por adelantado ya que esto es diferente dependiendo de la cobertura de su seguro. Sin embargo, es posible que podamos encontrar un medicamento sustituto a menor costo o llenar un formulario para que el seguro cubra el medicamento que se considera necesario.   Si se requiere una autorizacin previa para que su compaa de seguros cubra su medicamento, por favor permtanos de 1 a 2 das hbiles para completar este proceso.  Los precios de los medicamentos varan con frecuencia dependiendo del lugar de dnde se surte la receta y alguna farmacias pueden ofrecer precios ms baratos.  El sitio web www.goodrx.com tiene cupones para medicamentos de diferentes farmacias. Los precios aqu no tienen en cuenta lo que podra costar con la ayuda del seguro (puede ser ms barato con su seguro), pero el sitio web puede darle el precio si no utiliz ningn seguro.  - Puede   imprimir el cupn correspondiente y llevarlo con su receta a la farmacia.  - Tambin puede pasar por nuestra oficina durante el horario de atencin regular y recoger una tarjeta de cupones de GoodRx.  - Si necesita que su receta se enve electrnicamente a una farmacia diferente, informe a nuestra oficina a travs de MyChart de [place] o por telfono llamando al [phone] y presione la opcin 4.  

## 2021-07-30 NOTE — Progress Notes (Signed)
   Follow-Up Visit   Subjective  April Chang is a 61 y.o. female who presents for the following: Psoriasis (Otezla wasn't covered by insurance but patient is currently using Vtama cream and Clobetasol solution and is doing well. ). Patient had a traumatic brain injury and has difficulty communicating. The patient's sister is present with her and contributes to history.  The following portions of the chart were reviewed this encounter and updated as appropriate:   Tobacco  Allergies  Meds  Problems  Med Hx  Surg Hx  Fam Hx     Review of Systems:  No other skin or systemic complaints except as noted in HPI or Assessment and Plan.  Objective  Well appearing patient in no apparent distress; mood and affect are within normal limits.  A focused examination was performed including the face, scalp, and inframammary areas. Relevant physical exam findings are noted in the Assessment and Plan.  Inframammary, scalp Hyperpigmentations of the inframammary and axillary areas.    Assessment & Plan  Psoriasis Inframammary, scalp  Much improved since patient's first visit compared to photos (currently using Vtama cream and Clobetasol solution) -   Psoriasis is a chronic non-curable, but treatable genetic/hereditary disease that may have other systemic features affecting other organ systems such as joints (Psoriatic Arthritis). It is associated with an increased risk of inflammatory bowel disease, heart disease, non-alcoholic fatty liver disease, and depression.    Since patient is doing well we will hold off on restarting Otezla 30mg  po QD.   Continue Clobetasol solution to aa's scalp QOD and Vtama cream to aa's QD PRN.    Related Medications Tapinarof (VTAMA) 1 % CREA APPLY TO AFFECTED AREA OF PSORIASIS TOPICALLY ONCE EVERY DAY clobetasol (TEMOVATE) 0.05 % external solution To affected areas of scalp QD PRN up to 5d/wk. Avoid applying to face, groin, and axilla.  Return in about 6  months (around 01/30/2022) for psoriasis follow up .  03/31/2022, CMA, am acting as scribe for Maylene Roes, MD . Documentation: I have reviewed the above documentation for accuracy and completeness, and I agree with the above.  Armida Sans, MD

## 2021-08-01 ENCOUNTER — Ambulatory Visit
Admission: RE | Admit: 2021-08-01 | Discharge: 2021-08-01 | Disposition: A | Discharge status: Home / Self Care | Payer: Medicaid Other | Source: Ambulatory Visit | Attending: Internal Medicine | Admitting: Internal Medicine

## 2021-08-01 DIAGNOSIS — I509 Heart failure, unspecified: Secondary | ICD-10-CM

## 2021-08-01 DIAGNOSIS — R0602 Shortness of breath: Secondary | ICD-10-CM | POA: Diagnosis not present

## 2021-08-01 DIAGNOSIS — I10 Essential (primary) hypertension: Secondary | ICD-10-CM | POA: Diagnosis not present

## 2021-08-01 LAB — ECHOCARDIOGRAM COMPLETE
AR max vel: 2.15 cm2
AV Area VTI: 2.53 cm2
AV Area mean vel: 2.36 cm2
AV Mean grad: 2 mmHg
AV Peak grad: 3.7 mmHg
Ao pk vel: 0.96 m/s
Area-P 1/2: 5.23 cm2
S' Lateral: 2 cm

## 2021-08-01 NOTE — Progress Notes (Signed)
*  PRELIMINARY RESULTS* Echocardiogram 2D Echocardiogram has been performed.  April Chang 08/01/2021, 10:27 AM

## 2021-08-02 ENCOUNTER — Telehealth: Payer: Self-pay | Admitting: *Deleted

## 2021-08-02 NOTE — Telephone Encounter (Signed)
Attempted to call pt's guardian Kendal Hymen, no answer. Unable to leave voicemail, no vm set up.  Will try to reach at a later time.

## 2021-08-02 NOTE — Telephone Encounter (Signed)
-----   Message from Yvonne Kendall, MD sent at 08/01/2021  9:21 PM EDT ----- Please let April Chang know that her echocardiogram shows that her heart is contracting well without significant valvular abnormality.  There is mild stiffening of the heart muscle (diastolic dysfunction), which is quite common.  I am not convinced that these findings explain her progressive swelling, weight gain, and shortness of breath.  I recommend that we obtain a urinalysis and spot urine protein:creatinine ratio at her convenience to ensure that she does not have nephrotic syndrome contributing to her edema.  She should follow-up as planned later this month to reassess her symptoms since recent initiation of furosemide.

## 2021-08-07 ENCOUNTER — Encounter: Payer: Self-pay | Admitting: Dermatology

## 2021-08-09 DIAGNOSIS — N393 Stress incontinence (female) (male): Secondary | ICD-10-CM | POA: Diagnosis not present

## 2021-08-14 ENCOUNTER — Encounter: Payer: Self-pay | Admitting: Medical

## 2021-08-14 ENCOUNTER — Ambulatory Visit (INDEPENDENT_AMBULATORY_CARE_PROVIDER_SITE_OTHER): Payer: Medicaid Other | Admitting: Medical

## 2021-08-14 ENCOUNTER — Other Ambulatory Visit
Admission: RE | Admit: 2021-08-14 | Discharge: 2021-08-14 | Disposition: A | Discharge status: Home / Self Care | Payer: Medicaid Other | Attending: Medical | Admitting: Medical

## 2021-08-14 VITALS — BP 112/83 | HR 80 | Ht 62.0 in | Wt 162.6 lb

## 2021-08-14 DIAGNOSIS — I10 Essential (primary) hypertension: Secondary | ICD-10-CM | POA: Insufficient documentation

## 2021-08-14 DIAGNOSIS — I5032 Chronic diastolic (congestive) heart failure: Secondary | ICD-10-CM | POA: Diagnosis not present

## 2021-08-14 DIAGNOSIS — R0602 Shortness of breath: Secondary | ICD-10-CM | POA: Diagnosis not present

## 2021-08-14 DIAGNOSIS — M7989 Other specified soft tissue disorders: Secondary | ICD-10-CM

## 2021-08-14 LAB — BASIC METABOLIC PANEL
Anion gap: 7 (ref 5–15)
BUN: 19 mg/dL (ref 6–20)
CO2: 29 mmol/L (ref 22–32)
Calcium: 9.4 mg/dL (ref 8.9–10.3)
Chloride: 103 mmol/L (ref 98–111)
Creatinine, Ser: 0.91 mg/dL (ref 0.44–1.00)
GFR, Estimated: >60 mL/min (ref >60)
Glucose, Bld: 95 mg/dL (ref 70–99)
Potassium: 4 mmol/L (ref 3.5–5.1)
Sodium: 139 mmol/L (ref 135–145)

## 2021-08-14 NOTE — Progress Notes (Signed)
Cardiology Office Note:    Date:  08/14/2021   ID:  April Chang, DOB 05-Jun-1960, MRN [id number]  PCP:  April Chang  [facility] Cardiologist:  Yvonne Kendall, MD  [facility] Electrophysiologist:  None   Referring MD: [facility]*   Chief Complaint: Echo follow-up  History of Present Illness:    April Chang is a 61 y.o. female with a hx of HTN, GERD, TBI, psoriasis who presents for follow-up.   Seen 07/27/21 for leg swelling and SOB. Labs and an echo were ordered She was started on lasix 40mg  daily.   Echo showed LVEF 60-65%, no WMA, G1DD.  Today, the patient reports she has been doing well. She has been taking lasix 40mg  daily with improvement. Lower leg swelling is much better better. Has trace edema on exam. Also noted coughing has improved. Her sister reports the patient sleeps a lot. Unsure if she has sleep apnea, discussed sleep study, but they would like to wait at this time. BP is much better today. No chest pain reported.    Past Medical History:  Diagnosis Date   GERD (gastroesophageal reflux disease)    Hypertension    [facility] spotted fever    TBI (traumatic brain injury) (HCC)     History reviewed. No pertinent surgical history.  Current Medications: Current Meds  Medication Sig   clobetasol (TEMOVATE) 0.05 % external solution To affected areas of scalp QD PRN up to 5d/wk. Avoid applying to face, groin, and axilla.   Docusate Calcium (STOOL SOFTENER PO) Take 1 capsule by mouth every other day.   escitalopram (LEXAPRO) 10 MG tablet Take by mouth.   fluticasone (FLONASE) 50 MCG/ACT nasal spray Place 1 spray into both nostrils daily.   furosemide (LASIX) 40 MG tablet Take 1 tablet (40 mg total) by mouth daily.   levocetirizine (XYZAL) 5 MG tablet Take 5 mg by mouth daily.   lidocaine 4 % Place 1 patch onto the skin as directed.   montelukast (SINGULAIR) 10 MG tablet Take 10 mg by mouth daily.   omeprazole  (PRILOSEC) 20 MG capsule Take 20 mg by mouth daily.   Tapinarof (VTAMA) 1 % CREA APPLY TO AFFECTED AREA OF PSORIASIS TOPICALLY ONCE EVERY DAY     Allergies:   Patient has no known allergies.   Social History   Socioeconomic History   Marital status: Single    Spouse name: Not on file   Number of children: Not on file   Years of education: Not on file   Highest education level: Not on file  Occupational History   Not on file  Tobacco Use   Smoking status: Former    Packs/day: 0.50    Types: Cigarettes    Quit date: 61    Years since quitting: 33.5   Smokeless tobacco: Not on file  Vaping Use   Vaping Use: Never used  Substance and Sexual Activity   Alcohol use: Not Currently    Comment: Former alcoholic   Drug use: Not Currently    Types: Cocaine, Marijuana    Comment: many years ago   Sexual activity: Not on file  Other Topics Concern   Not on file  Social History Narrative   Not on file   Social Determinants of Health   Financial Resource Strain: Not on file  Food Insecurity: Not on file  Transportation Needs: Not on file  Physical Activity: Not on file  Stress: Not on file  Social Connections: Not on  file     Family History: The patient's family history includes Atrial fibrillation in her father; Breast cancer in her maternal aunt; Coronary artery disease in her mother.  ROS:   Please see the history of present illness.     All other systems reviewed and are negative.  EKGs/Labs/Other Studies Reviewed:    The following studies were reviewed today:  Echo 08/01/21   1. Left ventricular ejection fraction, by estimation, is 60 to 65%. The  left ventricle has normal function. The left ventricle has no regional  wall motion abnormalities. Left ventricular diastolic parameters are  consistent with Grade I diastolic  dysfunction (impaired relaxation).   2. Right ventricular systolic function is normal. The right ventricular  size is not well visualized.    3. The mitral valve is normal in structure. No evidence of mitral valve  regurgitation.   4. The aortic valve was not well visualized. Aortic valve regurgitation  is not visualized.   EKG:  EKG is not ordered today.    Recent Labs: 07/27/2021: ALT 14; B Natriuretic Peptide 15.3; BUN 17; Creatinine, Ser 0.75; Hemoglobin 13.6; Platelets 199; Potassium 4.0; Sodium 140; TSH 2.765  Recent Lipid Panel No results found for: "CHOL", "TRIG", "HDL", "CHOLHDL", "VLDL", "LDLCALC", "LDLDIRECT"   Physical Exam:    VS:  BP 112/83 (BP Location: Left Arm, Patient Position: Sitting, Cuff Size: Large)   Pulse 80   Ht 5\' 2"  (1.575 m)   Wt 162 lb 9.6 oz (73.8 kg)   SpO2 98%   BMI 29.74 kg/m     Wt Readings from Last 3 Encounters:  08/14/21 162 lb 9.6 oz (73.8 kg)  07/27/21 164 lb (74.4 kg)  05/30/21 170 lb (77.1 kg)     GEN:  Well nourished, well developed in no acute distress HEENT: Normal NECK: No JVD; No carotid bruits LYMPHATICS: No lymphadenopathy CARDIAC: RRR, no murmurs, rubs, gallops RESPIRATORY:  Clear to auscultation without rales, wheezing or rhonchi  ABDOMEN: Soft, non-tender, non-distended MUSCULOSKELETAL:  No edema; No deformity  SKIN: Warm and dry NEUROLOGIC:  Alert and oriented x 3 PSYCHIATRIC:  Normal affect   ASSESSMENT:    1. Chronic diastolic heart failure (HCC)   2. Essential hypertension   3. Leg swelling   4. Shortness of breath    PLAN:    In order of problems listed above:  SOB/LLE HFpEF Echo showed normal LVEF with G1DD. The patient reports improved LLE and coughing on the lasix. Continue lasix 40 mg daily. I will check a BMET today. She has trace lower leg edema on exam.   HTN BP is much better on lasix.   Disposition: Follow up in 3 month(s) with MD/APP     Signed, Aylan Bayona 07/30/21, PA-C  08/14/2021 9:10 AM    [facility]

## 2021-08-14 NOTE — Patient Instructions (Signed)
Medication Instructions:   Your physician recommends that you continue on your current medications as directed. Please refer to the Current Medication list given to you today.  *If you need a refill on your cardiac medications before your next appointment, please call your pharmacy*   Lab Work:  Today at the medical mall at [facility]: BMET  -  Please go to the Medical Mall Entrance at [facility] -  Check in at the Registration Desk: 1st desk to the right, past the screening table  If you have labs (blood work) drawn today and your tests are completely normal, you will receive your results only by: MyChart Message (if you have MyChart) OR A paper copy in the mail If you have any lab test that is abnormal or we need to change your treatment, we will call you to review the results.   Testing/Procedures:  None ordered   Follow-Up: At [facility], you and your health needs are our priority.  As part of our continuing mission to provide you with exceptional heart care, we have created designated Provider Care Teams.  These Care Teams include your primary Cardiologist (physician) and Advanced Practice Providers (APPs -  Physician Assistants and Nurse Practitioners) who all work together to provide you with the care you need, when you need it.  We recommend signing up for the patient portal called "MyChart".  Sign up information is provided on this After Visit Summary.  MyChart is used to connect with patients for Virtual Visits (Telemedicine).  Patients are able to view lab/test results, encounter notes, upcoming appointments, etc.  Non-urgent messages can be sent to your provider as well.   To learn more about what you can do with MyChart, go to [email or web address].    Your next appointment:   3 month(s)  The format for your next appointment:   In Person  Provider:   You may see Dr. Cristal Deer End or one of the following Advanced Practice Providers on your designated Care Team:    Nicolasa Ducking, NP Eula Listen, PA-C Cadence Fransico Michael, PA-C{   Important Information About Sugar

## 2021-08-21 DIAGNOSIS — Z419 Encounter for procedure for purposes other than remedying health state, unspecified: Secondary | ICD-10-CM | POA: Diagnosis not present

## 2021-08-22 ENCOUNTER — Other Ambulatory Visit: Payer: Self-pay | Admitting: Internal Medicine

## 2021-08-22 DIAGNOSIS — I509 Heart failure, unspecified: Secondary | ICD-10-CM

## 2021-08-23 DIAGNOSIS — R059 Cough, unspecified: Secondary | ICD-10-CM | POA: Diagnosis not present

## 2021-08-23 DIAGNOSIS — I509 Heart failure, unspecified: Secondary | ICD-10-CM | POA: Diagnosis not present

## 2021-09-04 ENCOUNTER — Encounter: Payer: Self-pay | Admitting: Medical

## 2021-09-04 ENCOUNTER — Ambulatory Visit (INDEPENDENT_AMBULATORY_CARE_PROVIDER_SITE_OTHER): Payer: Medicaid Other | Admitting: Medical

## 2021-09-04 VITALS — BP 120/86 | HR 87 | Ht 62.0 in | Wt 163.2 lb

## 2021-09-04 DIAGNOSIS — M7989 Other specified soft tissue disorders: Secondary | ICD-10-CM

## 2021-09-04 DIAGNOSIS — I5030 Unspecified diastolic (congestive) heart failure: Secondary | ICD-10-CM | POA: Diagnosis not present

## 2021-09-04 DIAGNOSIS — I10 Essential (primary) hypertension: Secondary | ICD-10-CM

## 2021-09-04 NOTE — Progress Notes (Signed)
Cardiology Office Note:    Date:  09/04/2021   ID:  April Chang, DOB 1960-05-16, MRN [id number]  PCP:  [facility], [facility]  [facility] Cardiologist:  Yvonne Kendall, MD  [facility] Electrophysiologist:  None   Referring MD: [facility], [facility]*   Chief Complaint: cough  History of Present Illness:    April Chang is a 61 y.o. female with a hx of with a hx of HTN, GERD, TBI, psoriasis who presents for follow-up.    Seen 07/27/21 for leg swelling and SOB. Labs and an echo were ordered She was started on lasix 40mg  daily.    Echo showed LVEF 60-65%, no WMA, G1DD.  Last seen 08/14/21 and was doing well. She had minimal improvement of LLE on lasix 40mg . Labs were ordered.   Today, the patient's sister is concerned about lower leg edema and couhg. She said when lasix was first started the cough went away, but a week ago, it came back. She does have allergies and is on medication. She feels lower leg edema may be worse as well. No chest pain. On exam she has trace edema on the left side.   Past Medical History:  Diagnosis Date   GERD (gastroesophageal reflux disease)    Hypertension    [facility] spotted fever    TBI (traumatic brain injury) (HCC)     History reviewed. No pertinent surgical history.  Current Medications: Current Meds  Medication Sig   clobetasol (TEMOVATE) 0.05 % external solution To affected areas of scalp QD PRN up to 5d/wk. Avoid applying to face, groin, and axilla.   Docusate Calcium (STOOL SOFTENER PO) Take 1 capsule by mouth every other day.   escitalopram (LEXAPRO) 10 MG tablet Take by mouth.   fluticasone (FLONASE) 50 MCG/ACT nasal spray Place 1 spray into both nostrils daily.   furosemide (LASIX) 40 MG tablet TAKE 1 TABLET BY MOUTH EVERY DAY   levocetirizine (XYZAL) 5 MG tablet Take 5 mg by mouth daily.   lidocaine 4 % Place 1 patch onto the skin as directed.   montelukast (SINGULAIR) 10 MG tablet Take 10 mg by  mouth daily.   omeprazole (PRILOSEC) 20 MG capsule Take 20 mg by mouth daily.   Tapinarof (VTAMA) 1 % CREA APPLY TO AFFECTED AREA OF PSORIASIS TOPICALLY ONCE EVERY DAY     Allergies:   Patient has no known allergies.   Social History   Socioeconomic History   Marital status: Single    Spouse name: Not on file   Number of children: Not on file   Years of education: Not on file   Highest education level: Not on file  Occupational History   Not on file  Tobacco Use   Smoking status: Former    Packs/day: 0.50    Types: Cigarettes    Quit date: 90    Years since quitting: 33.6   Smokeless tobacco: Not on file  Vaping Use   Vaping Use: Never used  Substance and Sexual Activity   Alcohol use: Not Currently    Comment: Former alcoholic   Drug use: Not Currently    Types: Cocaine, Marijuana    Comment: many years ago   Sexual activity: Not on file  Other Topics Concern   Not on file  Social History Narrative   Not on file   Social Determinants of Health   Financial Resource Strain: Not on file  Food Insecurity: Not on file  Transportation Needs: Not on file  Physical  Activity: Not on file  Stress: Not on file  Social Connections: Not on file     Family History: The patient's family history includes Atrial fibrillation in her father; Breast cancer in her maternal aunt; Coronary artery disease in her mother.  ROS:   Please see the history of present illness.     All other systems reviewed and are negative.  EKGs/Labs/Other Studies Reviewed:    The following studies were reviewed today:  Echo 08/01/21   1. Left ventricular ejection fraction, by estimation, is 60 to 65%. The  left ventricle has normal function. The left ventricle has no regional  wall motion abnormalities. Left ventricular diastolic parameters are  consistent with Grade I diastolic  dysfunction (impaired relaxation).   2. Right ventricular systolic function is normal. The right ventricular  size  is not well visualized.   3. The mitral valve is normal in structure. No evidence of mitral valve  regurgitation.   4. The aortic valve was not well visualized. Aortic valve regurgitation  is not visualized.   EKG:  EKG is not ordered today.   Recent Labs: 07/27/2021: ALT 14; B Natriuretic Peptide 15.3; Hemoglobin 13.6; Platelets 199; TSH 2.765 08/14/2021: BUN 19; Creatinine, Ser 0.91; Potassium 4.0; Sodium 139  Recent Lipid Panel No results found for: "CHOL", "TRIG", "HDL", "CHOLHDL", "VLDL", "LDLCALC", "LDLDIRECT" \  Physical Exam:    VS:  BP 120/86 (BP Location: Left Arm, Patient Position: Sitting, Cuff Size: Normal)   Pulse 87   Ht 5\' 2"  (1.575 m)   Wt 163 lb 3.2 oz (74 kg)   SpO2 96%   BMI 29.85 kg/m     Wt Readings from Last 3 Encounters:  09/04/21 163 lb 3.2 oz (74 kg)  08/14/21 162 lb 9.6 oz (73.8 kg)  07/27/21 164 lb (74.4 kg)     GEN:  Well nourished, well developed in no acute distress HEENT: Normal NECK: No JVD; No carotid bruits LYMPHATICS: No lymphadenopathy CARDIAC: RRR, no murmurs, rubs, gallops RESPIRATORY:  Clear to auscultation without rales, wheezing or rhonchi  ABDOMEN: Soft, non-tender, non-distended MUSCULOSKELETAL:  trace lower leg edema left; No deformity  SKIN: Warm and dry NEUROLOGIC:  Alert and oriented x 3 PSYCHIATRIC:  Normal affect   ASSESSMENT:    1. Leg swelling   2. Heart failure with preserved ejection fraction, unspecified HF chronicity (HCC)   3. Essential hypertension    PLAN:    In order of problems listed above:  LLE HFpEF Family member is concerned about the patient's cough and lower leg edema. She is taking lasix 40mg  daily. On exam lungs are clear and she has trace left lower leg edema. Some puffiness noted as well. I will check a BNP, but suspect it will be normal. I suggest she see PCP if cough persists.   HTN BP is good today. Continue lasix.   Disposition: Follow up in 3 month(s) with MD/APP      Signed, Cashae Weich 09/27/21, PA-C  09/04/2021 3:17 PM    [facility]

## 2021-09-04 NOTE — Patient Instructions (Signed)
Medication Instructions:   Your physician recommends that you continue on your current medications as directed. Please refer to the Current Medication list given to you today.  *If you need a refill on your cardiac medications before your next appointment, please call your pharmacy*   Lab Work:  BNP today   -  Please go to the Medical Mall Entrance at [facility] -  Check in at the Registration Desk: 1st desk to the right, past the screening table   If you have labs (blood work) drawn today and your tests are completely normal, you will receive your results only by: MyChart Message (if you have MyChart) OR A paper copy in the mail If you have any lab test that is abnormal or we need to change your treatment, we will call you to review the results.   Testing/Procedures:  None ordered   Follow-Up: At [facility], you and your health needs are our priority.  As part of our continuing mission to provide you with exceptional heart care, we have created designated Provider Care Teams.  These Care Teams include your primary Cardiologist (physician) and Advanced Practice Providers (APPs -  Physician Assistants and Nurse Practitioners) who all work together to provide you with the care you need, when you need it.  We recommend signing up for the patient portal called "MyChart".  Sign up information is provided on this After Visit Summary.  MyChart is used to connect with patients for Virtual Visits (Telemedicine).  Patients are able to view lab/test results, encounter notes, upcoming appointments, etc.  Non-urgent messages can be sent to your provider as well.   To learn more about what you can do with MyChart, go to [email or web address].    Your next appointment:   3 month(s)  The format for your next appointment:   In Person  Provider:   You may see Yvonne Kendall, MD or one of the following Advanced Practice Providers on your designated Care Team:   Nicolasa Ducking, NP Eula Listen,  PA-C Cadence Fransico Michael, [place]  Important Information About Sugar

## 2021-09-04 NOTE — Telephone Encounter (Signed)
Results reviewed with pt at office visit 08/14/21.

## 2021-09-11 ENCOUNTER — Ambulatory Visit: Payer: Medicaid Other | Admitting: Physician Assistant

## 2021-09-21 DIAGNOSIS — Z419 Encounter for procedure for purposes other than remedying health state, unspecified: Secondary | ICD-10-CM | POA: Diagnosis not present

## 2021-09-25 DIAGNOSIS — N393 Stress incontinence (female) (male): Secondary | ICD-10-CM | POA: Diagnosis not present

## 2021-10-12 ENCOUNTER — Other Ambulatory Visit: Payer: Self-pay | Admitting: Internal Medicine

## 2021-10-12 DIAGNOSIS — I509 Heart failure, unspecified: Secondary | ICD-10-CM

## 2021-10-17 ENCOUNTER — Other Ambulatory Visit: Payer: Self-pay | Admitting: *Deleted

## 2021-10-17 NOTE — Patient Instructions (Signed)
Visit Information  Ms. Trysta Sermersheim  - as a part of your Medicaid benefit, you are eligible for care management and care coordination services at no cost or copay. I was unable to reach you by phone today but would be happy to help you with your health related needs. Please feel free to call me @ [phone].   A member of the Managed Medicaid care management team will reach out to you again over the next 14 days.   Morgin Halls RN, BSN [place]  [facility] RN Care Coordinator   

## 2021-10-17 NOTE — Patient Outreach (Signed)
  Medicaid Managed Care   Unsuccessful Attempt Note   10/17/2021 Name: Bradi Arbuthnot MRN: [id number] DOB: 1960/02/20  Referred by: [facility], [facility] Reason for referral : High Risk Managed Medicaid (Unsuccessful RNCM initial telephone outreach)   An unsuccessful telephone outreach was attempted today. The patient was referred to the case management team for assistance with care management and care coordination.    Follow Up Plan: A HIPAA compliant phone message was left for the patient providing contact information and requesting a return call.    Lurena Joiner RN, BSN [place]  [employer] manager

## 2021-10-21 DIAGNOSIS — Z419 Encounter for procedure for purposes other than remedying health state, unspecified: Secondary | ICD-10-CM | POA: Diagnosis not present

## 2021-10-25 DIAGNOSIS — N393 Stress incontinence (female) (male): Secondary | ICD-10-CM | POA: Diagnosis not present

## 2021-10-30 ENCOUNTER — Other Ambulatory Visit: Payer: Self-pay | Admitting: *Deleted

## 2021-10-30 NOTE — Patient Outreach (Signed)
Medicaid Managed Care   Nurse Care Manager Note  10/30/2021 Name:  April Chang MRN:  [id number] DOB:  10-30-1960  April Chang is an 61 y.o. year old female who is a primary patient of Center, [facility].  The [facility] Managed Care Coordination team was consulted for assistance with:    CHF TBI  April Chang was given information about Medicaid Managed Care Coordination team services today. April Chang Legal Guardian agreed to services and verbal consent obtained.  Engaged with patient by telephone for initial visit in response to provider referral for case management and/or care coordination services.   Assessments/Interventions:  Review of past medical history, allergies, medications, health status, including review of consultants reports, laboratory and other test data, was performed as part of comprehensive evaluation and provision of chronic care management services.  SDOH (Social Determinants of Health) assessments and interventions performed: SDOH Interventions    Flowsheet Row Patient Outreach Telephone from 10/30/2021 in [facility] Community Care Coordination  SDOH Interventions   Housing Interventions Intervention Not Indicated  Transportation Interventions Intervention Not Indicated       Care Plan  No Known Allergies  Medications Reviewed Today     Reviewed by Heidi Dach, RN (Registered Nurse) on 10/30/21 at 1401  Med List Status: <None>   Medication Order Taking? Sig Documenting Provider Last Dose Status Informant  clobetasol (TEMOVATE) 0.05 % external solution [id number] Yes To affected areas of scalp QD PRN up to 5d/wk. Avoid applying to face, groin, and axilla. Deirdre Evener, MD Taking Active   Docusate Calcium (STOOL SOFTENER PO) [id number] Yes Take 1 capsule by mouth every other day. [provider] Taking Active   escitalopram (LEXAPRO) 10 MG tablet [id number] Yes Take by mouth. [provider] Taking Active   fluticasone (FLONASE) 50 MCG/ACT nasal spray [id number] Yes Place 1 spray into both nostrils daily. [provider] Taking Active   furosemide (LASIX) 40 MG tablet [id number] Yes TAKE 1 TABLET BY MOUTH EVERY DAY End, Christopher, MD Taking Active   levocetirizine (XYZAL) 5 MG tablet [id number] Yes Take 5 mg by mouth daily. [provider] Taking Active   lidocaine 4 % [id number] Yes Place 1 patch onto the skin as directed. [provider] Taking Active   montelukast (SINGULAIR) 10 MG tablet [id number] Yes Take 10 mg by mouth daily. [provider] Taking Active   omeprazole (PRILOSEC) 20 MG capsule [id number] Yes Take 20 mg by mouth daily. [provider] Taking Active   Tapinarof (VTAMA) 1 % CREA [id number] Yes APPLY TO AFFECTED AREA OF PSORIASIS TOPICALLY ONCE EVERY DAY Deirdre Evener, MD Taking Active             Patient Active Problem List   Diagnosis Date Noted   Asthma without status asthmaticus 08/30/2020   Brain trauma (HCC) 08/30/2020   Chicken pox 08/30/2020   Coma (HCC) 08/30/2020   Gastric ulcer 08/30/2020   Hypertension 08/30/2020   Psoriasis 08/30/2020   Tick fever 08/30/2020   Closed fracture of distal end of ulna 05/21/2014   Closed fracture of maxillary sinus (HCC) 05/21/2014   Closed fracture of orbital floor (HCC) 05/21/2014   Alcohol use 04/25/2014   Benzodiazepine abuse (HCC) 04/25/2014   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) 04/24/2014   Anxiety about health 03/14/2014   B12 deficiency 01/04/2014   Difficulty walking 01/04/2014   Palpitations 01/04/2014   DDD (degenerative disc disease),  lumbar 06/07/2013   Lumbar radiculitis 06/07/2013   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    Conditions to be addressed/monitored per PCP order:  CHF and TBI  Care Plan : RN Care Manager Plan of Care  Updates made by Melissa Montane, RN  since 10/30/2021 12:00 AM     Problem: Health Management needs related to CHF and TBI      Long-Range Goal: Development of Plan of Care to address Health Management needs related to CHF and TBI   Start Date: 10/30/2021  Expected End Date: 01/28/2022  Priority: High  Note:   Current Barriers:  Chronic Disease Management support and education needs related to CHF and TBI  RNCM Clinical Goal(s):  Patient's legal guardian will verbalize understanding of plan for management of CHF and TBI as evidenced by Patient's legal guardian reports take all medications exactly as prescribed and will call provider for medication related questions as evidenced by verbal report and EMR documentation    attend all scheduled medical appointments: 11/22/21 with PCP and 12/05/21 with Cardiology as evidenced by provider documentation        continue to work with Camilla and/or Social Worker to address care management and care coordination needs related to CHF and TBI as evidenced by adherence to CM Team Scheduled appointments     through collaboration with Consulting civil engineer, provider, and care team.   Interventions: Inter-disciplinary care team collaboration (see longitudinal plan of care) Evaluation of current treatment plan related to  self management and patient's adherence to plan as established by provider   Heart Failure Interventions:  (Status: New goal.)  Long Term Goal  Wt Readings from Last 3 Encounters:  09/04/21 163 lb 3.2 oz (74 kg)  08/14/21 162 lb 9.6 oz (73.8 kg)  07/27/21 164 lb (74.4 kg)   Basic overview and discussion of pathophysiology of Heart Failure reviewed Provided education on low sodium diet Reviewed Heart Failure Action Plan in depth and provided written copy Discussed importance of daily weight and advised patient to weigh and record daily Reviewed role of diuretics in prevention of fluid overload and management of heart failure Discussed the importance of keeping all  appointments with provider Assessed social determinant of health barriers  Traumatic Brain Injury  (Status: New goal.) Long Term Goal  Evaluation of current treatment plan related to  TBI ,  self-management and patient's adherence to plan as established by provider. Discussed plans with patient for ongoing care management follow up and provided patient with direct contact information for care management team Advised patient to discuss Neurology referral with PCP during visit on 11/22/21-missed Neurology consult with Dr. 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  Plan: The Managed Medicaid care management team will reach out to the patient again over the next 30 days.  Date/time of next scheduled RN care management/care coordination outreach:  12/06/21 @ 2:30pm  Estanislado Emms RN, BSN [place]  [facility] RN Care Coordinator

## 2021-10-30 NOTE — Patient Instructions (Signed)
Visit Information  April Chang was given information about Medicaid Managed Care team care coordination services as a part of their [facility] Medicaid benefit. April Chang verbally consented to engagement with the [facility] Managed Care team.   If you are experiencing a medical emergency, please call 911 or report to your local emergency department or urgent care.   If you have a non-emergency medical problem during routine business hours, please contact your provider's office and ask to speak with a nurse.   For questions related to your [facility] health plan, please call: [phone] or go here:[email or web address]  If you would like to schedule transportation through your [facility] plan, please call the following number at least 2 days in advance of your appointment: [phone].  You can also use the MTM portal or MTM mobile app to manage your rides. For the portal, please go to [email or web address].  Call the [facility] at [phone], at any time, 24 hours a day, 7 days a week. If you are in danger or need immediate medical attention call 911.  If you would like help to quit smoking, call 1-800-QUIT-NOW 780-740-1011) OR Espaol: 1-855-Djelo-Ya (7-341-937-9024) o para ms informacin haga clic aqu or Text READY to 200-400 to register via text  April Chang,   Please see education materials related to CHF provided as print materials.   The patient verbalized understanding of instructions, educational materials, and care plan provided today and agreed to receive a mailed copy of patient instructions, educational materials, and care plan.   Telephone follow up appointment with Managed Medicaid care management team member scheduled for:12/06/21 @ 2:30pm  Lurena Joiner RN, BSN [place] RN Care Coordinator   Following is a copy of your plan of care:  Care Plan : RN Care Manager Plan of Care  Updates made  by Melissa Montane, RN since 10/30/2021 12:00 AM     Problem: Health Management needs related to CHF and TBI      Long-Range Goal: Development of Plan of Care to address Health Management needs related to CHF and TBI   Start Date: 10/30/2021  Expected End Date: 01/28/2022  Priority: High  Note:   Current Barriers:  Chronic Disease Management support and education needs related to CHF and TBI  RNCM Clinical Goal(s):  Patient's legal guardian will verbalize understanding of plan for management of CHF and TBI as evidenced by Patient's legal guardian reports take all medications exactly as prescribed and will call provider for medication related questions as evidenced by verbal report and EMR documentation    attend all scheduled medical appointments: 11/22/21 with PCP and 12/05/21 with Cardiology as evidenced by provider documentation        continue to work with [place] and/or Social Worker to address care management and care coordination needs related to CHF and TBI as evidenced by adherence to CM Team Scheduled appointments     through collaboration with Consulting civil engineer, provider, and care team.   Interventions: Inter-disciplinary care team collaboration (see longitudinal plan of care) Evaluation of current treatment plan related to  self management and patient's adherence to plan as established by provider   Heart Failure Interventions:  (Status: New goal.)  Long Term Goal  Wt Readings from Last 3 Encounters:  09/04/21 163 lb 3.2 oz (74 kg)  08/14/21 162 lb 9.6 oz (73.8 kg)  07/27/21 164 lb (74.4 kg)   Basic overview and discussion of pathophysiology of Heart Failure reviewed Provided  education on low sodium diet Reviewed Heart Failure Action Plan in depth and provided written copy Discussed importance of daily weight and advised patient to weigh and record daily Reviewed role of diuretics in prevention of fluid overload and management of heart failure Discussed the  importance of keeping all appointments with provider Assessed social determinant of health barriers  Traumatic Brain Injury  (Status: New goal.) Long Term Goal  Evaluation of current treatment plan related to  TBI ,  self-management and patient's adherence to plan as established by provider. Discussed plans with patient for ongoing care management follow up and provided patient with direct contact information for care management team Advised patient to discuss Neurology referral with PCP during visit on 11/22/21-missed Neurology consult with Dr. 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

## 2021-10-31 DIAGNOSIS — Z741 Need for assistance with personal care: Secondary | ICD-10-CM | POA: Diagnosis not present

## 2021-11-01 DIAGNOSIS — Z741 Need for assistance with personal care: Secondary | ICD-10-CM | POA: Diagnosis not present

## 2021-11-02 DIAGNOSIS — I10 Essential (primary) hypertension: Secondary | ICD-10-CM | POA: Diagnosis not present

## 2021-11-03 DIAGNOSIS — I10 Essential (primary) hypertension: Secondary | ICD-10-CM | POA: Diagnosis not present

## 2021-11-05 DIAGNOSIS — I10 Essential (primary) hypertension: Secondary | ICD-10-CM | POA: Diagnosis not present

## 2021-11-06 DIAGNOSIS — I10 Essential (primary) hypertension: Secondary | ICD-10-CM | POA: Diagnosis not present

## 2021-11-07 DIAGNOSIS — I10 Essential (primary) hypertension: Secondary | ICD-10-CM | POA: Diagnosis not present

## 2021-11-08 DIAGNOSIS — I10 Essential (primary) hypertension: Secondary | ICD-10-CM | POA: Diagnosis not present

## 2021-11-09 DIAGNOSIS — I10 Essential (primary) hypertension: Secondary | ICD-10-CM | POA: Diagnosis not present

## 2021-11-10 DIAGNOSIS — I10 Essential (primary) hypertension: Secondary | ICD-10-CM | POA: Diagnosis not present

## 2021-11-12 DIAGNOSIS — I10 Essential (primary) hypertension: Secondary | ICD-10-CM | POA: Diagnosis not present

## 2021-11-13 DIAGNOSIS — I10 Essential (primary) hypertension: Secondary | ICD-10-CM | POA: Diagnosis not present

## 2021-11-14 DIAGNOSIS — I10 Essential (primary) hypertension: Secondary | ICD-10-CM | POA: Diagnosis not present

## 2021-11-15 ENCOUNTER — Ambulatory Visit: Payer: Medicaid Other | Admitting: Medical

## 2021-11-15 DIAGNOSIS — I10 Essential (primary) hypertension: Secondary | ICD-10-CM | POA: Diagnosis not present

## 2021-11-16 DIAGNOSIS — I10 Essential (primary) hypertension: Secondary | ICD-10-CM | POA: Diagnosis not present

## 2021-11-19 DIAGNOSIS — I10 Essential (primary) hypertension: Secondary | ICD-10-CM | POA: Diagnosis not present

## 2021-11-20 DIAGNOSIS — I10 Essential (primary) hypertension: Secondary | ICD-10-CM | POA: Diagnosis not present

## 2021-11-21 DIAGNOSIS — I10 Essential (primary) hypertension: Secondary | ICD-10-CM | POA: Diagnosis not present

## 2021-11-21 DIAGNOSIS — Z419 Encounter for procedure for purposes other than remedying health state, unspecified: Secondary | ICD-10-CM | POA: Diagnosis not present

## 2021-11-22 DIAGNOSIS — Z87820 Personal history of traumatic brain injury: Secondary | ICD-10-CM | POA: Diagnosis not present

## 2021-11-22 DIAGNOSIS — Z0131 Encounter for examination of blood pressure with abnormal findings: Secondary | ICD-10-CM | POA: Diagnosis not present

## 2021-11-22 DIAGNOSIS — Z1389 Encounter for screening for other disorder: Secondary | ICD-10-CM | POA: Diagnosis not present

## 2021-11-22 DIAGNOSIS — Z0130 Encounter for examination of blood pressure without abnormal findings: Secondary | ICD-10-CM | POA: Diagnosis not present

## 2021-11-22 DIAGNOSIS — I10 Essential (primary) hypertension: Secondary | ICD-10-CM | POA: Diagnosis not present

## 2021-11-22 DIAGNOSIS — Z124 Encounter for screening for malignant neoplasm of cervix: Secondary | ICD-10-CM | POA: Diagnosis not present

## 2021-11-22 DIAGNOSIS — Z113 Encounter for screening for infections with a predominantly sexual mode of transmission: Secondary | ICD-10-CM | POA: Diagnosis not present

## 2021-11-22 DIAGNOSIS — M2550 Pain in unspecified joint: Secondary | ICD-10-CM | POA: Diagnosis not present

## 2021-11-23 DIAGNOSIS — I10 Essential (primary) hypertension: Secondary | ICD-10-CM | POA: Diagnosis not present

## 2021-11-24 DIAGNOSIS — I10 Essential (primary) hypertension: Secondary | ICD-10-CM | POA: Diagnosis not present

## 2021-11-26 DIAGNOSIS — I10 Essential (primary) hypertension: Secondary | ICD-10-CM | POA: Diagnosis not present

## 2021-11-28 DIAGNOSIS — I10 Essential (primary) hypertension: Secondary | ICD-10-CM | POA: Diagnosis not present

## 2021-11-30 DIAGNOSIS — I10 Essential (primary) hypertension: Secondary | ICD-10-CM | POA: Diagnosis not present

## 2021-12-01 DIAGNOSIS — I10 Essential (primary) hypertension: Secondary | ICD-10-CM | POA: Diagnosis not present

## 2021-12-03 DIAGNOSIS — I10 Essential (primary) hypertension: Secondary | ICD-10-CM | POA: Diagnosis not present

## 2021-12-05 ENCOUNTER — Ambulatory Visit: Payer: Medicaid Other | Attending: Internal Medicine | Admitting: Internal Medicine

## 2021-12-05 ENCOUNTER — Encounter: Payer: Self-pay | Admitting: Internal Medicine

## 2021-12-05 VITALS — BP 120/78 | HR 81 | Ht 61.0 in | Wt 164.0 lb

## 2021-12-05 DIAGNOSIS — M7989 Other specified soft tissue disorders: Secondary | ICD-10-CM | POA: Diagnosis not present

## 2021-12-05 DIAGNOSIS — I509 Heart failure, unspecified: Secondary | ICD-10-CM

## 2021-12-05 DIAGNOSIS — I10 Essential (primary) hypertension: Secondary | ICD-10-CM | POA: Diagnosis not present

## 2021-12-05 MED ORDER — FUROSEMIDE 40 MG PO TABS: 40.0000 mg | ORAL_TABLET | Freq: Every day | ORAL | 10 refills | Status: DC | PRN

## 2021-12-05 NOTE — Progress Notes (Signed)
Follow-up Outpatient Visit Date: 12/05/2021  Primary Care Provider: [facility], [place] [address]  Chief Complaint: Follow-up leg swelling  HPI:  April Chang is a 61 y.o. female with history of diastolic dysfunction, hypertension, GERD, traumatic brain injury, and psoriasis, who presents for follow-up of edema.  I met her in July, at which time she complained of progressive leg swelling over the preceding 4 months with associated dyspnea and cough.  Echocardiogram showed normal LVEF with mild diastolic dysfunction.  She was started on furosemide 40 mg daily and reported significant improvement in edema at her follow-up visit with Cadence Kathlen Mody, [place], in July.  Sleep study was discussed, though April Chang wished to defer this.  Today, April Chang reports that she is doing fairly well with only minimal leg edema.  On further questioning, she reports that she only took a single dose of furosemide after our initial visit and has not had any significant recurrence of her leg swelling.  She otherwise feels well without chest pain, shortness of breath, palpitations, or lightheadedness.  --------------------------------------------------------------------------------------------------  Cardiovascular History & Procedures: Cardiovascular Problems: Leg swelling and shortness of breath   Risk Factors: Hypertension   Cath/PCI: None   CV Surgery: None   EP Procedures and Devices: None   Non-Invasive Evaluation(s): TTE (08/01/2021): Normal LV size and wall thickness.  LVEF 60-65% with normal wall motion.  Grade 1 diastolic dysfunction.  Normal RV function (size not well assessed).  No significant valvular abnormality. TTE (04/24/2014, [facility]): Normal LV size and wall thickness.  LVEF 65-70%.  Normal RV size and function.  No significant valvular abnormality.  Recent CV Pertinent Labs: Lab Results  Component Value Date   BNP 15.3 07/27/2021   K 4.0  08/14/2021   BUN 19 08/14/2021   CREATININE 0.91 08/14/2021    Past medical and surgical history were reviewed and updated in EPIC.  Current Meds  Medication Sig   clobetasol (TEMOVATE) 0.05 % external solution To affected areas of scalp QD PRN up to 5d/wk. Avoid applying to face, groin, and axilla.   Docusate Calcium (STOOL SOFTENER PO) Take 1 capsule by mouth every other day.   escitalopram (LEXAPRO) 10 MG tablet Take 10 mg by mouth daily.   fluticasone (FLONASE) 50 MCG/ACT nasal spray Place 1 spray into both nostrils daily.   levocetirizine (XYZAL) 5 MG tablet Take 5 mg by mouth daily.   lidocaine 4 % Place 1 patch onto the skin as directed.   montelukast (SINGULAIR) 10 MG tablet Take 10 mg by mouth daily.   omeprazole (PRILOSEC) 20 MG capsule Take 20 mg by mouth daily.   Tapinarof (VTAMA) 1 % CREA APPLY TO AFFECTED AREA OF PSORIASIS TOPICALLY ONCE EVERY DAY   VENTOLIN HFA 108 (90 Base) MCG/ACT inhaler Inhale 2 puffs into the lungs every 4 (four) hours as needed.    Allergies: Patient has no known allergies.  Social History   Tobacco Use   Smoking status: Former    Packs/day: 0.50    Types: Cigarettes    Quit date: 1990    Years since quitting: 33.8  Vaping Use   Vaping Use: Never used  Substance Use Topics   Alcohol use: Not Currently    Comment: Former alcoholic   Drug use: Not Currently    Types: Cocaine, Marijuana    Comment: many years ago    Family History  Problem Relation Age of Onset   Coronary artery disease Mother    Atrial  fibrillation Father    Breast cancer Maternal Aunt     Review of Systems: A 12-system review of systems was performed and was negative except as noted in the HPI.  --------------------------------------------------------------------------------------------------  Physical Exam: BP 120/78 (BP Location: Right Arm, Patient Position: Sitting, Cuff Size: Normal)   Pulse 81   Ht _0  (1.549 m)   Wt 164 lb (74.4 kg)   SpO2 95%    BMI 30.99 kg/m   General:  NAD.  Accompanied by caregiver. Neck: No JVD or HJR. Lungs: Clear to auscultation bilaterally without wheezes or crackles. Heart: Regular rate and rhythm without murmurs, rubs, or gallops. Abdomen: Soft, nontender, nondistended. Extremities: Trace ankle edema bilaterally.  Lab Results  Component Value Date   WBC 7.0 07/27/2021   HGB 13.6 07/27/2021   HCT 41.2 07/27/2021   MCV 92.2 07/27/2021   PLT 199 07/27/2021    Lab Results  Component Value Date   NA 139 08/14/2021   K 4.0 08/14/2021   CL 103 08/14/2021   CO2 29 08/14/2021   BUN 19 08/14/2021   CREATININE 0.91 08/14/2021   GLUCOSE 95 08/14/2021   ALT 14 07/27/2021    --------------------------------------------------------------------------------------------------  ASSESSMENT AND PLAN: Leg swelling: This has resolved with a single dose of furosemide.  Exam today notable for trace pretibial edema.  I have encouraged April Chang to elevate her legs when possible and to minimize her sodium intake.  She can use furosemide 40 mg daily as needed if she were to have recurrent edema.  Echocardiogram reassuring with only mild diastolic dysfunction and no other structural abnormalities.  No further cardiac testing or intervention is recommended at this time.  Follow-up: Return to clinic as needed.  Nelva Bush, MD 12/06/2021 4:32 PM

## 2021-12-05 NOTE — Patient Instructions (Signed)
Medication Instructions:  DECREASE: Lasix to 40 mg daily as needed  *If you need a refill on your cardiac medications before your next appointment, please call your pharmacy*   Lab Work: None ordered today   Testing/Procedures: None ordered today   Follow-Up: At [facility], you and your health needs are our priority.  As part of our continuing mission to provide you with exceptional heart care, we have created designated Provider Care Teams.  These Care Teams include your primary Cardiologist (physician) and Advanced Practice Providers (APPs -  Physician Assistants and Nurse Practitioners) who all work together to provide you with the care you need, when you need it.  We recommend signing up for the patient portal called "MyChart".  Sign up information is provided on this After Visit Summary.  MyChart is used to connect with patients for Virtual Visits (Telemedicine).  Patients are able to view lab/test results, encounter notes, upcoming appointments, etc.  Non-urgent messages can be sent to your provider as well.   To learn more about what you can do with MyChart, go to [email or web address].    Your next appointment:   As needed  The format for your next appointment:   In Person  Provider:   You may see Yvonne Kendall, MD or one of the following Advanced Practice Providers on your designated Care Team:   Nicolasa Ducking, NP Eula Listen, PA-C Cadence Fransico Michael, PA-C Charlsie Quest, NP

## 2021-12-06 ENCOUNTER — Ambulatory Visit: Payer: Medicaid Other | Admitting: *Deleted

## 2021-12-06 ENCOUNTER — Encounter: Payer: Self-pay | Admitting: Internal Medicine

## 2021-12-06 DIAGNOSIS — Z23 Encounter for immunization: Secondary | ICD-10-CM | POA: Diagnosis not present

## 2021-12-07 DIAGNOSIS — I10 Essential (primary) hypertension: Secondary | ICD-10-CM | POA: Diagnosis not present

## 2021-12-10 DIAGNOSIS — I10 Essential (primary) hypertension: Secondary | ICD-10-CM | POA: Diagnosis not present

## 2021-12-11 ENCOUNTER — Other Ambulatory Visit: Payer: Medicaid Other | Admitting: *Deleted

## 2021-12-11 ENCOUNTER — Encounter: Payer: Self-pay | Admitting: *Deleted

## 2021-12-11 NOTE — Patient Outreach (Signed)
Medicaid Managed Care   Nurse Care Manager Note  12/11/2021 Name:  April Chang MRN:  [id number] DOB:  1960-04-03  April Chang is an 61 y.o. year old female who is a primary patient of [facility] [employer].  The [facility] Managed Care Coordination team was consulted for assistance with:    CHF TBI  Ms. Calixto was given information about Medicaid Managed Care Coordination team services today. Elka [place] Legal Guardian agreed to services and verbal consent obtained.  Engaged with patient by telephone for follow up visit in response to provider referral for case management and/or care coordination services.   Assessments/Interventions:  Review of past medical history, allergies, medications, health status, including review of consultants reports, laboratory and other test data, was performed as part of comprehensive evaluation and provision of chronic care management services.  SDOH (Social Determinants of Health) assessments and interventions performed: SDOH Interventions    Flowsheet Row Patient Outreach Telephone from 12/11/2021 in [facility] Patient Outreach Telephone from 10/30/2021 in [facility] Care Coordination  SDOH Interventions    Food Insecurity Interventions Intervention Not Indicated --  Housing Interventions -- Intervention Not Indicated  Transportation Interventions -- Intervention Not Indicated  Utilities Interventions Intervention Not Indicated --       Care Plan  No Known Allergies  Medications Reviewed Today     Reviewed by Heidi Dach, RN (Registered Nurse) on 12/11/21 at 1131  Med List Status: <None>   Medication Order Taking? Sig Documenting Provider Last Dose Status Informant  clobetasol (TEMOVATE) 0.05 % external solution [id number] Yes To affected areas of scalp QD PRN up to 5d/wk. Avoid applying to face, groin, and axilla. Deirdre Evener, MD Taking Active    Docusate Calcium (STOOL SOFTENER PO) [id number] No Take 1 capsule by mouth every other day.  Patient not taking: Reported on 12/11/2021   [provider] Not Taking Active   escitalopram (LEXAPRO) 10 MG tablet [id number] Yes Take 10 mg by mouth daily. [provider] Taking Active   fluticasone (FLONASE) 50 MCG/ACT nasal spray [id number] Yes Place 1 spray into both nostrils daily. [provider] Taking Active   furosemide (LASIX) 40 MG tablet [id number] Yes Take 1 tablet (40 mg total) by mouth daily as needed. End, Cristal Deer, MD Taking Active   levocetirizine (XYZAL) 5 MG tablet [id number] Yes Take 5 mg by mouth daily. [provider] Taking Active   lidocaine 4 % [id number] No Place 1 patch onto the skin as directed.  Patient not taking: Reported on 12/11/2021   [provider] Not Taking Active   montelukast (SINGULAIR) 10 MG tablet [id number] Yes Take 10 mg by mouth daily. [provider] Taking Active   Multiple Vitamin (MULTIVITAMIN) tablet [id number] Yes Take 1 tablet by mouth daily. [provider] Taking Active   omeprazole (PRILOSEC) 20 MG capsule [id number] Yes Take 20 mg by mouth daily. [provider] Taking Active   Tapinarof (VTAMA) 1 % CREA [id number] Yes APPLY TO AFFECTED AREA OF PSORIASIS TOPICALLY ONCE EVERY DAY Deirdre Evener, MD Taking Active   VENTOLIN HFA 108 [phone] Base) MCG/ACT inhaler [id number] Yes Inhale 2 puffs into the lungs every 4 (four) hours as needed. 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) 04/24/2014   Anxiety about health 03/14/2014   B12 deficiency 01/04/2014   Difficulty walking 01/04/2014   Palpitations 01/04/2014   DDD (degenerative disc disease), lumbar 06/07/2013   Lumbar radiculitis 06/07/2013   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    Conditions to be addressed/monitored per PCP order:  CHF and TBI  Care Plan : RN Care Manager Plan of Care  Updates made by Heidi Dach, RN since 12/11/2021 12:00 AM     Problem: Health Management needs related to CHF and TBI      Long-Range Goal: Development of Plan of Care to address Health Management needs related to CHF and TBI   Start Date: 10/30/2021  Expected End Date: 01/28/2022  Priority: High  Note:   Current Barriers:  Chronic Disease Management support and education needs related to CHF and TBI  RNCM Clinical Goal(s):  Patient's legal guardian will verbalize understanding of plan for management of CHF and TBI as evidenced by Patient's legal guardian reports take all medications exactly as prescribed and will call provider for medication related questions as evidenced by verbal report and EMR documentation    attend all scheduled medical appointments: Neurology 12/19/21 @ 3 pm and Vision Exam on 02/25/22 as evidenced by provider documentation        continue to work with Medical illustrator and/or Social Worker to address care management and care coordination needs related to CHF and TBI as evidenced by adherence to CM Team Scheduled appointments     through collaboration with Medical illustrator, provider, and care team.   Interventions: Inter-disciplinary care team collaboration (see longitudinal plan of care) Evaluation of current treatment plan related to  self management and patient's adherence  to plan as established by provider   Heart Failure Interventions:  (Status: Goal on Track (progressing): YES.)  Long Term Goal  Wt Readings from Last 3 Encounters:  12/05/21 164 lb (74.4 kg)  09/04/21 163 lb 3.2 oz (74 kg)  08/14/21 162 lb 9.6 oz (73.8 kg)   Discussed the importance of keeping all appointments with provider Provided patient with education about the role of exercise in the management of heart failure Assessed social determinant of health barriers  Traumatic Brain Injury  (Status: Goal on Track (progressing): YES.) Long Term Goal  Evaluation of current treatment plan related to  TBI ,  self-management and patient's adherence to plan as established by provider. 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-can contact [facility] for Ophthalmology-Appointment scheduled with [facility] on 02/25/22 Discussed waiting on PT referral Discussed Neurology Evaluation scheduled on 12/19/21 @ 3 pm  Patient Goals/Self-Care Activities: Take medications as prescribed   Attend all scheduled provider appointments Call provider office for new concerns or questions  call office if I gain more  than 2 pounds in one day or 5 pounds in one week use salt in moderation weigh myself daily eat more whole grains, fruits and vegetables, lean meats and healthy fats       Follow Up:  Patient agrees to Care Plan and Follow-up.  Plan: The Managed Medicaid care management team will reach out to the patient again over the next 60 days.  Date/time of next scheduled RN care management/care coordination outreach:  02/11/21 @ 1:15 am  Estanislado Emms RN, BSN [facility]  [facility] RN Care Coordinator

## 2021-12-11 NOTE — Patient Instructions (Signed)
Visit Information  Ms. Stickle's Legal Guardian was given information about Medicaid Managed Care team care coordination services as a part of their [facility] Medicaid benefit. Derica Appenzeller's legal Guardian verbally consented to engagement with the [facility] Managed Care team.   If you are experiencing a medical emergency, please call 911 or report to your local emergency department or urgent care.   If you have a non-emergency medical problem during routine business hours, please contact your provider's office and ask to speak with a nurse.   For questions related to your [facility] health plan, please call: [phone] or go here:[email or web address]  If you would like to schedule transportation through your [facility] plan, please call the following number at least 2 days in advance of your appointment: [phone].  You can also use the MTM portal or MTM mobile app to manage your rides. For the portal, please go to [email or web address]  Call the [facility] Crisis Line at [phone], at any time, 24 hours a day, 7 days a week. If you are in danger or need immediate medical attention call 911.  If you would like help to quit smoking, call 1-800-QUIT-NOW (870-581-2430) OR Espaol: 1-855-Djelo-Ya (4-431-540-0867) o para ms informacin haga clic aqu or Text READY to 619-509 to register via text  Ms. Sypher,   Please see education materials related to exercise provided by MyChart link.  Patient verbalizes understanding of instructions and care plan provided today and agrees to view in MyChart. Active MyChart status and patient understanding of how to access instructions and care plan via MyChart confirmed with patient.     Telephone follow up appointment with Managed Medicaid care management team member scheduled for:02/11/21 @ 1:15 pm  Estanislado Emms RN, BSN [place]  [facility] RN Care Coordinator   Following is a copy of your  plan of care:  Care Plan : RN Care Manager Plan of Care  Updates made by Heidi Dach, RN since 12/11/2021 12:00 AM     Problem: Health Management needs related to CHF and TBI      Long-Range Goal: Development of Plan of Care to address Health Management needs related to CHF and TBI   Start Date: 10/30/2021  Expected End Date: 01/28/2022  Priority: High  Note:   Current Barriers:  Chronic Disease Management support and education needs related to CHF and TBI  RNCM Clinical Goal(s):  Patient's legal guardian will verbalize understanding of plan for management of CHF and TBI as evidenced by Patient's legal guardian reports take all medications exactly as prescribed and will call provider for medication related questions as evidenced by verbal report and EMR documentation    attend all scheduled medical appointments: Neurology 12/19/21 @ 3 pm and Vision Exam on 02/25/22 as evidenced by provider documentation        continue to work with Medical illustrator and/or Social Worker to address care management and care coordination needs related to CHF and TBI as evidenced by adherence to CM Team Scheduled appointments     through collaboration with Medical illustrator, provider, and care team.   Interventions: Inter-disciplinary care team collaboration (see longitudinal plan of care) Evaluation of current treatment plan related to  self management and patient's adherence to plan as established by provider   Heart Failure Interventions:  (Status: Goal on Track (progressing): YES.)  Long Term Goal  Wt Readings from Last 3 Encounters:  12/05/21 164 lb (74.4 kg)  09/04/21 163 lb 3.2 oz (74 kg)  08/14/21  162 lb 9.6 oz (73.8 kg)   Discussed the importance of keeping all appointments with provider Provided patient with education about the role of exercise in the management of heart failure Assessed social determinant of health barriers  Traumatic Brain Injury  (Status: Goal on Track (progressing): YES.)  Long Term Goal  Evaluation of current treatment plan related to  TBI ,  self-management and patient's adherence to plan as established by provider. 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

## 2021-12-12 DIAGNOSIS — I10 Essential (primary) hypertension: Secondary | ICD-10-CM | POA: Diagnosis not present

## 2021-12-14 DIAGNOSIS — I10 Essential (primary) hypertension: Secondary | ICD-10-CM | POA: Diagnosis not present

## 2021-12-17 DIAGNOSIS — I10 Essential (primary) hypertension: Secondary | ICD-10-CM | POA: Diagnosis not present

## 2021-12-17 DIAGNOSIS — Z1211 Encounter for screening for malignant neoplasm of colon: Secondary | ICD-10-CM | POA: Diagnosis not present

## 2021-12-19 DIAGNOSIS — R519 Headache, unspecified: Secondary | ICD-10-CM | POA: Diagnosis not present

## 2021-12-19 DIAGNOSIS — I10 Essential (primary) hypertension: Secondary | ICD-10-CM | POA: Diagnosis not present

## 2021-12-19 DIAGNOSIS — R413 Other amnesia: Secondary | ICD-10-CM | POA: Diagnosis not present

## 2021-12-19 DIAGNOSIS — E538 Deficiency of other specified B group vitamins: Secondary | ICD-10-CM | POA: Diagnosis not present

## 2021-12-19 DIAGNOSIS — R2689 Other abnormalities of gait and mobility: Secondary | ICD-10-CM | POA: Diagnosis not present

## 2021-12-19 DIAGNOSIS — G479 Sleep disorder, unspecified: Secondary | ICD-10-CM | POA: Diagnosis not present

## 2021-12-19 DIAGNOSIS — R52 Pain, unspecified: Secondary | ICD-10-CM | POA: Diagnosis not present

## 2021-12-19 DIAGNOSIS — Z87820 Personal history of traumatic brain injury: Secondary | ICD-10-CM | POA: Diagnosis not present

## 2021-12-21 DIAGNOSIS — Z419 Encounter for procedure for purposes other than remedying health state, unspecified: Secondary | ICD-10-CM | POA: Diagnosis not present

## 2021-12-21 DIAGNOSIS — I10 Essential (primary) hypertension: Secondary | ICD-10-CM | POA: Diagnosis not present

## 2021-12-24 DIAGNOSIS — I10 Essential (primary) hypertension: Secondary | ICD-10-CM | POA: Diagnosis not present

## 2021-12-26 DIAGNOSIS — I10 Essential (primary) hypertension: Secondary | ICD-10-CM | POA: Diagnosis not present

## 2021-12-28 DIAGNOSIS — I10 Essential (primary) hypertension: Secondary | ICD-10-CM | POA: Diagnosis not present

## 2021-12-31 DIAGNOSIS — I10 Essential (primary) hypertension: Secondary | ICD-10-CM | POA: Diagnosis not present

## 2022-01-02 DIAGNOSIS — I10 Essential (primary) hypertension: Secondary | ICD-10-CM | POA: Diagnosis not present

## 2022-01-04 DIAGNOSIS — I10 Essential (primary) hypertension: Secondary | ICD-10-CM | POA: Diagnosis not present

## 2022-01-06 DIAGNOSIS — I10 Essential (primary) hypertension: Secondary | ICD-10-CM | POA: Diagnosis not present

## 2022-01-07 DIAGNOSIS — I10 Essential (primary) hypertension: Secondary | ICD-10-CM | POA: Diagnosis not present

## 2022-01-09 DIAGNOSIS — I10 Essential (primary) hypertension: Secondary | ICD-10-CM | POA: Diagnosis not present

## 2022-01-11 DIAGNOSIS — I10 Essential (primary) hypertension: Secondary | ICD-10-CM | POA: Diagnosis not present

## 2022-01-13 DIAGNOSIS — I10 Essential (primary) hypertension: Secondary | ICD-10-CM | POA: Diagnosis not present

## 2022-01-16 DIAGNOSIS — I10 Essential (primary) hypertension: Secondary | ICD-10-CM | POA: Diagnosis not present

## 2022-01-18 DIAGNOSIS — I10 Essential (primary) hypertension: Secondary | ICD-10-CM | POA: Diagnosis not present

## 2022-01-19 DIAGNOSIS — I10 Essential (primary) hypertension: Secondary | ICD-10-CM | POA: Diagnosis not present

## 2022-01-21 DIAGNOSIS — Z419 Encounter for procedure for purposes other than remedying health state, unspecified: Secondary | ICD-10-CM | POA: Diagnosis not present

## 2022-01-22 DIAGNOSIS — I10 Essential (primary) hypertension: Secondary | ICD-10-CM | POA: Diagnosis not present

## 2022-01-23 ENCOUNTER — Encounter: Payer: Self-pay | Admitting: Emergency Medicine

## 2022-01-23 ENCOUNTER — Emergency Department
Admission: EM | Admit: 2022-01-23 | Discharge: 2022-01-23 | Disposition: A | Discharge status: Home / Self Care | Payer: Medicaid Other | Attending: Emergency Medicine | Admitting: Emergency Medicine

## 2022-01-23 ENCOUNTER — Telehealth: Payer: Self-pay | Admitting: Internal Medicine

## 2022-01-23 ENCOUNTER — Other Ambulatory Visit: Payer: Self-pay

## 2022-01-23 ENCOUNTER — Emergency Department: Payer: Medicaid Other

## 2022-01-23 DIAGNOSIS — I5033 Acute on chronic diastolic (congestive) heart failure: Secondary | ICD-10-CM | POA: Diagnosis not present

## 2022-01-23 DIAGNOSIS — I110 Hypertensive heart disease with heart failure: Secondary | ICD-10-CM | POA: Diagnosis not present

## 2022-01-23 DIAGNOSIS — R110 Nausea: Secondary | ICD-10-CM | POA: Diagnosis not present

## 2022-01-23 DIAGNOSIS — J45909 Unspecified asthma, uncomplicated: Secondary | ICD-10-CM | POA: Insufficient documentation

## 2022-01-23 DIAGNOSIS — R051 Acute cough: Secondary | ICD-10-CM | POA: Insufficient documentation

## 2022-01-23 DIAGNOSIS — Z1152 Encounter for screening for COVID-19: Secondary | ICD-10-CM | POA: Diagnosis not present

## 2022-01-23 DIAGNOSIS — R112 Nausea with vomiting, unspecified: Secondary | ICD-10-CM | POA: Diagnosis not present

## 2022-01-23 DIAGNOSIS — J439 Emphysema, unspecified: Secondary | ICD-10-CM | POA: Diagnosis not present

## 2022-01-23 DIAGNOSIS — R059 Cough, unspecified: Secondary | ICD-10-CM | POA: Diagnosis not present

## 2022-01-23 DIAGNOSIS — I5023 Acute on chronic systolic (congestive) heart failure: Secondary | ICD-10-CM | POA: Diagnosis not present

## 2022-01-23 HISTORY — DX: Heart failure, unspecified: I50.9

## 2022-01-23 LAB — BASIC METABOLIC PANEL
Anion gap: 10 (ref 5–15)
BUN: 14 mg/dL (ref 6–20)
CO2: 28 mmol/L (ref 22–32)
Calcium: 9 mg/dL (ref 8.9–10.3)
Chloride: 101 mmol/L (ref 98–111)
Creatinine, Ser: 0.71 mg/dL (ref 0.44–1.00)
GFR, Estimated: >60 mL/min (ref >60)
Glucose, Bld: 100 mg/dL — ABNORMAL HIGH (ref 70–99)
Potassium: 3.5 mmol/L (ref 3.5–5.1)
Sodium: 139 mmol/L (ref 135–145)

## 2022-01-23 LAB — RESP PANEL BY RT-PCR (RSV, FLU A&B, COVID)  RVPGX2
Influenza A by PCR: NEGATIVE
Influenza B by PCR: NEGATIVE
Resp Syncytial Virus by PCR: NEGATIVE
SARS Coronavirus 2 by RT PCR: NEGATIVE

## 2022-01-23 LAB — CBC
HCT: 44.6 % (ref 36.0–46.0)
Hemoglobin: 14.6 g/dL (ref 12.0–15.0)
MCH: 30 pg (ref 26.0–34.0)
MCHC: 32.7 g/dL (ref 30.0–36.0)
MCV: 91.6 fL (ref 80.0–100.0)
Platelets: 213 10*3/uL (ref 150–400)
RBC: 4.87 MIL/uL (ref 3.87–5.11)
RDW: 12.6 % (ref 11.5–15.5)
WBC: 8.2 10*3/uL (ref 4.0–10.5)
nRBC: 0 % (ref 0.0–0.2)

## 2022-01-23 MED ORDER — FUROSEMIDE 40 MG PO TABS
40.0000 mg | ORAL_TABLET | Freq: Once | ORAL | Status: AC
  Administered 2022-01-23: 40 mg via ORAL
  Filled 2022-01-23: qty 1

## 2022-01-23 MED ORDER — ONDANSETRON 4 MG PO TBDP
4.0000 mg | ORAL_TABLET | Freq: Once | ORAL | Status: AC
  Administered 2022-01-23: 4 mg via ORAL
  Filled 2022-01-23: qty 1

## 2022-01-23 MED ORDER — ONDANSETRON 4 MG PO TBDP: 4.0000 mg | ORAL_TABLET | Freq: Three times a day (TID) | ORAL | 0 refills | Status: DC | PRN

## 2022-01-23 NOTE — Telephone Encounter (Signed)
Per chart review, pt currently in the ED.

## 2022-01-23 NOTE — ED Provider Notes (Signed)
[facility] Provider Note    Event Date/Time   First MD Initiated Contact with Patient 01/23/22 1102     (approximate)   History   Chief Complaint Cough  HPI  April Chang is a 62 y.o. female with past medical history of TBI, hypertension, asthma, and CHF who presents to the ED complaining of cough.  80 of history is obtained from patient's sister, who is also her caregiver.  Sister reports that patient has been dealing with a dry cough over the past 24 to 48 hours, has also had slight weight gain of about 3 pounds during this time.  She has felt nauseous with poor p.o. intake, but has not had any vomiting or diarrhea.  Patient denies any pain in her chest or difficulty breathing, but has noticed a small amount of swelling in her legs.  Sister reports she has been compliant with her diuretic medication and has not had any increased salt intake.     Physical Exam   Triage Vital Signs: ED Triage Vitals  Enc Vitals Group     BP 01/23/22 1003 134/79     Pulse Rate 01/23/22 1003 92     Resp 01/23/22 1003 16     Temp 01/23/22 1003 97.8 F (36.6 C)     Temp Source 01/23/22 1003 Oral     SpO2 01/23/22 1003 98 %     Weight --      Height --      Head Circumference --      Peak Flow --      Pain Score 01/23/22 1004 0     Pain Loc --      Pain Edu? --      Excl. in [place]? --     Most recent vital signs: Vitals:   01/23/22 1003  BP: 134/79  Pulse: 92  Resp: 16  Temp: 97.8 F (36.6 C)  SpO2: 98%    Constitutional: Alert and oriented. Eyes: Conjunctivae are normal. Head: Atraumatic. Nose: No congestion/rhinnorhea. Mouth/Throat: Mucous membranes are moist.  Cardiovascular: Normal rate, regular rhythm. Grossly normal heart sounds.  2+ radial pulses bilaterally. Respiratory: Normal respiratory effort.  No retractions. Lungs CTAB. Gastrointestinal: Soft and nontender. No distention. Musculoskeletal: No lower extremity tenderness, 1+ pitting  edema to mid shins bilaterally. Neurologic:  Normal speech and language. No gross focal neurologic deficits are appreciated.    ED Results / Procedures / Treatments   Labs (all labs ordered are listed, but only abnormal results are displayed) Labs Reviewed  BASIC METABOLIC PANEL - Abnormal; Notable for the following components:      Result Value   Glucose, Bld 100 (*)    All other components within normal limits  RESP PANEL BY RT-PCR (RSV, FLU A&B, COVID)  RVPGX2  CBC     EKG  ED ECG REPORT I, Blake Divine, the attending physician, personally viewed and interpreted this ECG.   Date: 01/23/2022  EKG Time: 10:15  Rate: 79  Rhythm: normal sinus rhythm  Axis: Normal  Intervals:none  ST&T Change: None  RADIOLOGY Chest x-ray reviewed and interpreted by me with no infiltrate, edema, or effusion.  PROCEDURES:  Critical Care performed: No  Procedures   MEDICATIONS ORDERED IN ED: Medications  furosemide (LASIX) tablet 40 mg (has no administration in time range)  ondansetron (ZOFRAN-ODT) disintegrating tablet 4 mg (4 mg Oral Given 01/23/22 1223)     IMPRESSION / MDM / ASSESSMENT AND PLAN / ED COURSE  I reviewed  the triage vital signs and the nursing notes.                              62 y.o. female with past medical history of hypertension, asthma, diastolic CHF, and TBI who presents to the ED with cough and weight gain over the past 24 to 48 hours with decreased p.o. intake.  Patient's presentation is most consistent with acute presentation with potential threat to life or bodily function.  Differential diagnosis includes, but is not limited to, CHF exacerbation, COPD, asthma, pneumonia, bronchitis, COVID-19, influenza, electrolyte abnormality, anemia, AKI.  Patient well-appearing and in no acute distress, vital signs are unremarkable.  EKG shows no evidence of arrhythmia or ischemia and patient does not have any symptoms concerning for ACS at this time.  She has a  cough but denies any difficulty breathing and is maintaining oxygen saturations at 98% on room air.  Chest x-ray is unremarkable, no evidence of pulmonary edema or pneumonia, although patient does appear slightly fluid overloaded with lower extremity edema and reported weight gain.  We will give extra dose of Lasix, also treat nausea with Zofran.  She also may have a component of viral illness to explain her symptoms, although testing for COVID-19 and influenza is negative.  Labs are reassuring with no significant anemia, leukocytosis, electrode abnormality, or AKI.  Patient reports feeling better following dose of Lasix and Zofran, is appropriate for discharge home with follow-up in the heart failure clinic as well as with her PCP.  Caregiver counseled to have her return to the ED for new or worsening symptoms, patient and caregiver agree with plan.      FINAL CLINICAL IMPRESSION(S) / ED DIAGNOSES   Final diagnoses:  Acute cough  Acute on chronic diastolic congestive heart failure (HCC)  Nausea     Rx / DC Orders   ED Discharge Orders          Ordered    AMB referral to CHF clinic        01/23/22 1226    ondansetron (ZOFRAN-ODT) 4 MG disintegrating tablet  Every 8 hours PRN        01/23/22 1227             Note:  This document was prepared using Dragon voice recognition software and may include unintentional dictation errors.   Blake Divine, MD 01/23/22 1228

## 2022-01-23 NOTE — ED Provider Triage Note (Signed)
Emergency Medicine Provider Triage Evaluation Note  April Chang , a 62 y.o. female  was evaluated in triage.  Pt complains of cough several days, decreased appetite, chest tightness and reported weight gain of 4 lbs over last 3 days.  Review of Systems  Positive: CHF Negative: No reported fever, chills  Physical Exam  BP 134/79 (BP Location: Left Arm)   Pulse 92   Temp 97.8 F (36.6 C) (Oral)   Resp 16   SpO2 98%  Gen:   Awake, no distress   Resp:  Normal effort  Lungs clear bilaterally MSK:   Moves extremities without difficulty  Other:    Medical Decision Making  Medically screening exam initiated at 10:06 AM.  Appropriate orders placed.  April Chang was informed that the remainder of the evaluation will be completed by another provider, this initial triage assessment does not replace that evaluation, and the importance of remaining in the ED until their evaluation is complete.     April Hai, PA-C 01/23/22 1008

## 2022-01-23 NOTE — ED Triage Notes (Signed)
Has history of heart failure.  Patient's care giver reports weight gain 4 lb over 3 days.  Also c/o nausea and decreased appetite.  Cough returned over past 3-4 days.  Patient arrives AAOx3.  Skin warm and dry. No SOB/ DOE.  NAD

## 2022-01-23 NOTE — Telephone Encounter (Signed)
Pt c/o swelling: STAT is pt has developed SOB within 24 hours  If swelling, where is the swelling located? A little bit in feet and legs  How much weight have you gained and in what time span? 4 lbs in 2 -3 days  Have you gained 3 pounds in a day or 5 pounds in a week? no  Do you have a log of your daily weights (if so, list)? Dec 27th 161 lbs, for 2 days she was 162 lbs, today 163 lbs almost 164 lbs  Are you currently taking a fluid pill? yes  Are you currently SOB? No, but has a cough  Have you traveled recently? no  Patient's sister states the patient has had a sudden weight gained she will be taking her to the ED. She says the patient has not been running to the bathroom like normal. She says she gave the patient an extra half tablet of her fluid pill with her regular dose. She says the patient has also been nauseas and her cough has returned.

## 2022-01-23 NOTE — ED Notes (Signed)
First Nurse Note: Pt to ED via POV for weight gain, cough, and decreased appetite. Pt has hx/o CHF. Pt is ambulatory and in NAD.

## 2022-01-24 DIAGNOSIS — I10 Essential (primary) hypertension: Secondary | ICD-10-CM | POA: Diagnosis not present

## 2022-01-25 DIAGNOSIS — I10 Essential (primary) hypertension: Secondary | ICD-10-CM | POA: Diagnosis not present

## 2022-01-28 DIAGNOSIS — I10 Essential (primary) hypertension: Secondary | ICD-10-CM | POA: Diagnosis not present

## 2022-01-29 DIAGNOSIS — I10 Essential (primary) hypertension: Secondary | ICD-10-CM | POA: Diagnosis not present

## 2022-01-30 DIAGNOSIS — I10 Essential (primary) hypertension: Secondary | ICD-10-CM | POA: Diagnosis not present

## 2022-01-31 DIAGNOSIS — I10 Essential (primary) hypertension: Secondary | ICD-10-CM | POA: Diagnosis not present

## 2022-02-01 DIAGNOSIS — N393 Stress incontinence (female) (male): Secondary | ICD-10-CM | POA: Diagnosis not present

## 2022-02-04 ENCOUNTER — Encounter: Payer: Self-pay | Admitting: Dermatology

## 2022-02-04 ENCOUNTER — Ambulatory Visit (INDEPENDENT_AMBULATORY_CARE_PROVIDER_SITE_OTHER): Payer: Medicaid Other | Admitting: Dermatology

## 2022-02-04 VITALS — BP 119/74 | HR 85

## 2022-02-04 DIAGNOSIS — L409 Psoriasis, unspecified: Secondary | ICD-10-CM

## 2022-02-04 DIAGNOSIS — L304 Erythema intertrigo: Secondary | ICD-10-CM | POA: Diagnosis not present

## 2022-02-04 DIAGNOSIS — Z79899 Other long term (current) drug therapy: Secondary | ICD-10-CM | POA: Diagnosis not present

## 2022-02-04 DIAGNOSIS — L408 Other psoriasis: Secondary | ICD-10-CM | POA: Diagnosis not present

## 2022-02-04 DIAGNOSIS — L820 Inflamed seborrheic keratosis: Secondary | ICD-10-CM | POA: Diagnosis not present

## 2022-02-04 DIAGNOSIS — I10 Essential (primary) hypertension: Secondary | ICD-10-CM | POA: Diagnosis not present

## 2022-02-04 NOTE — Progress Notes (Signed)
   Follow-Up Visit   Subjective  April Chang is a 62 y.o. female who presents for the following: Psoriasis (6 month follow up. Scalp, inframammary. Using Clobetasol solution and Vtama cream as directed) and lesion (Above pubic area, right side. Knot under skin). The patient has spots, moles and lesions to be evaluated, some may be new or changing and the patient has concerns that these could be cancer. Caretaker with patient.   The following portions of the chart were reviewed this encounter and updated as appropriate:  Tobacco  Allergies  Meds  Problems  Med Hx  Surg Hx  Fam Hx     Review of Systems: No other skin or systemic complaints except as noted in HPI or Assessment and Plan.  Objective  Well appearing patient in no apparent distress; mood and affect are within normal limits.  A focused examination was performed including scalp, face, chest. Relevant physical exam findings are noted in the Assessment and Plan.  Scalp Shiny patches, lichenification   B/L suprapubic area x2 (2) Erythematous keratotic or waxy stuck-on papule or plaque.   Assessment & Plan  Psoriasis With Inverse Psoriasis And Intertrigo Scalp; Inframammary Counseling on psoriasis and coordination of care  psoriasis is a chronic non-curable, but treatable genetic/hereditary disease that may have other systemic features affecting other organ systems such as joints (Psoriatic Arthritis). It is associated with an increased risk of inflammatory bowel disease, heart disease, non-alcoholic fatty liver disease, and depression.  Treatments include light and laser treatments; topical medications; and systemic medications including oral and injectables. Chronic and persistent condition with duration or expected duration over one year. Condition is symptomatic / bothersome to patient. Not to goal.  Use Clobetasol solution every other day as needed only for active itching/scaling areas. Do not use if not itching  or scaly. Continue Vtama cream daily to breast folds.   Topical steroids (such as triamcinolone, fluocinolone, fluocinonide, mometasone, clobetasol, halobetasol, betamethasone, hydrocortisone) can cause thinning and lightening of the skin if they are used for too long in the same area. Your physician has selected the right strength medicine for your problem and area affected on the body. Please use your medication only as directed by your physician to prevent side effects.   Related Medications Tapinarof (VTAMA) 1 % CREA APPLY TO AFFECTED AREA OF PSORIASIS TOPICALLY ONCE EVERY DAY clobetasol (TEMOVATE) 0.05 % external solution To affected areas of scalp QD PRN up to 5d/wk. Avoid applying to face, groin, and axilla.  Inflamed seborrheic keratosis (2) B/L suprapubic area x2 Symptomatic, irritating, patient would like treated.  Destruction of lesion - B/L suprapubic area x2 Complexity: simple   Destruction method: cryotherapy   Informed consent: discussed and consent obtained   Timeout:  patient name, date of birth, surgical site, and procedure verified Lesion destroyed using liquid nitrogen: Yes   Region frozen until ice ball extended beyond lesion: Yes   Outcome: patient tolerated procedure well with no complications   Post-procedure details: wound care instructions given   Additional details:  Prior to procedure, discussed risks of blister formation, small wound, skin dyspigmentation, or rare scar following cryotherapy. Recommend Vaseline ointment to treated areas while healing.   Return in about 6 months (around 08/05/2022) for Psoriasis Follow Up.  I, Emelia Salisbury, CMA, am acting as scribe for Sarina Ser, MD. Documentation: I have reviewed the above documentation for accuracy and completeness, and I agree with the above.  Sarina Ser, MD

## 2022-02-04 NOTE — Patient Instructions (Addendum)
Use Clobetasol solution every other day as needed only for active itching/scaling areas. Do not use if not itching or scaly.  Continue Vtama cream daily to breast folds. Start Vtama cream to pubic area once daily.   Topical steroids (such as triamcinolone, fluocinolone, fluocinonide, mometasone, clobetasol, halobetasol, betamethasone, hydrocortisone) can cause thinning and lightening of the skin if they are used for too long in the same area. Your physician has selected the right strength medicine for your problem and area affected on the body. Please use your medication only as directed by your physician to prevent side effects.    Cryotherapy Aftercare  Wash gently with soap and water everyday.   Apply Vaseline and Band-Aid daily until healed.   Due to recent changes in healthcare laws, you may see results of your pathology and/or laboratory studies on MyChart before the doctors have had a chance to review them. We understand that in some cases there may be results that are confusing or concerning to you. Please understand that not all results are received at the same time and often the doctors may need to interpret multiple results in order to provide you with the best plan of care or course of treatment. Therefore, we ask that you please give [place] 2 business days to thoroughly review all your results before contacting the office for clarification. Should we see a critical lab result, you will be contacted sooner.   If You Need Anything After Your Visit  If you have any questions or concerns for your doctor, please call our main line at [phone] and press option 4 to reach your doctor's medical assistant. If no one answers, please leave a voicemail as directed and we will return your call as soon as possible. Messages left after 4 pm will be answered the following business day.   You may also send [place] a message via [place]. We typically respond to MyChart messages within 1-2 business  days.  For prescription refills, please ask your pharmacy to contact our office. Our fax number is [phone].  If you have an urgent issue when the clinic is closed that cannot wait until the next business day, you can page your doctor at the number below.    Please note that while we do our best to be available for urgent issues outside of office hours, we are not available 24/7.   If you have an urgent issue and are unable to reach [place], you may choose to seek medical care at your doctor's office, retail clinic, urgent care center, or emergency room.  If you have a medical emergency, please immediately call 911 or go to the emergency department.  Pager Numbers  - Dr. Nehemiah Massed: [phone]  - Dr. Laurence Ferrari: [phone]  - Dr. Nicole Kindred: [phone]  In the event of inclement weather, please call our main line at [phone] for an update on the status of any delays or closures.  Dermatology Medication Tips: Please keep the boxes that topical medications come in in order to help keep track of the instructions about where and how to use these. Pharmacies typically print the medication instructions only on the boxes and not directly on the medication tubes.   If your medication is too expensive, please contact our office at [phone] option 4 or send [place] a message through [place].   We are unable to tell what your co-pay for medications will be in advance as this is different depending on your insurance coverage. However, we may be able to find  a substitute medication at lower cost or fill out paperwork to get insurance to cover a needed medication.   If a prior authorization is required to get your medication covered by your insurance company, please allow [place] 1-2 business days to complete this process.  Drug prices often vary depending on where the prescription is filled and some pharmacies may offer cheaper prices.  The website www.goodrx.com contains coupons for medications through  different pharmacies. The prices here do not account for what the cost may be with help from insurance (it may be cheaper with your insurance), but the website can give you the price if you did not use any insurance.  - You can print the associated coupon and take it with your prescription to the pharmacy.  - You may also stop by our office during regular business hours and pick up a GoodRx coupon card.  - If you need your prescription sent electronically to a different pharmacy, notify our office through [facility] or by phone at [phone] option 4.     Si Usted Necesita Algo Despus de Su Visita  Tambin puede enviarnos un mensaje a travs de Pharmacist, community. Por lo general respondemos a los mensajes de MyChart en el transcurso de 1 a 2 das hbiles.  Para renovar recetas, por favor pida a su farmacia que se ponga en contacto con nuestra oficina. Harland Dingwall de fax es [place] [phone].  Si tiene un asunto urgente cuando la clnica est cerrada y que no puede esperar hasta el siguiente da hbil, puede llamar/localizar a su doctor(a) al nmero que aparece a continuacin.   Por favor, tenga en cuenta que aunque hacemos todo lo posible para estar disponibles para asuntos urgentes fuera del horario de [place], no estamos disponibles las 24 horas del da, los 7 das de la [place].   Si tiene un problema urgente y no puede comunicarse con nosotros, puede optar por buscar atencin mdica  en el consultorio de su doctor(a), en una clnica privada, en un centro de atencin urgente o en una sala de emergencias.  Si tiene Engineering geologist, por favor llame inmediatamente al 911 o vaya a la sala de emergencias.  Nmeros de bper  - Dr. Nehemiah Massed: [phone]  - Dra. Moye: [phone]  - Dra. Nicole Kindred: [phone]  En caso de inclemencias del [place], por favor llame a Johnsie Kindred principal al [phone] para una actualizacin sobre el [place] de cualquier retraso o cierre.  Consejos  para la medicacin en dermatologa: Por favor, guarde las cajas en las que vienen los medicamentos de uso tpico para ayudarle a seguir las instrucciones sobre dnde y cmo usarlos. Las farmacias generalmente imprimen las instrucciones del medicamento slo en las cajas y no directamente en los tubos del [place].   Si su medicamento es muy caro, por favor, pngase en contacto con Zigmund Daniel llamando al [phone] y presione la opcin 4 o envenos un mensaje a travs de Pharmacist, community.   No podemos decirle cul ser su copago por los medicamentos por adelantado ya que esto es diferente dependiendo de la cobertura de su seguro. Sin embargo, es posible que podamos encontrar un medicamento sustituto a Electrical engineer un formulario para que el seguro cubra el medicamento que se considera necesario.   Si se requiere una autorizacin previa para que su compaa de seguros [place] su medicamento, por favor permtanos de 1 a 2 das hbiles para completar este proceso.  Los precios de los medicamentos varan con frecuencia dependiendo del  lugar de dnde se surte la receta y alguna farmacias pueden ofrecer precios ms baratos.  El sitio web www.goodrx.com tiene cupones para medicamentos de Airline pilot. Los precios aqu no tienen en cuenta lo que podra costar con la ayuda del seguro (puede ser ms barato con su seguro), pero el sitio web puede darle el precio si no utiliz Research scientist (physical sciences).  - Puede imprimir el cupn correspondiente y llevarlo con su receta a la farmacia.  - Tambin puede pasar por nuestra oficina durante el horario de atencin regular y Charity fundraiser una tarjeta de cupones de GoodRx.  - Si necesita que su receta se enve electrnicamente a una farmacia diferente, informe a nuestra oficina a travs de MyChart de [place] o por telfono llamando al [phone] y presione la opcin 4.

## 2022-02-05 DIAGNOSIS — I10 Essential (primary) hypertension: Secondary | ICD-10-CM | POA: Diagnosis not present

## 2022-02-07 DIAGNOSIS — I10 Essential (primary) hypertension: Secondary | ICD-10-CM | POA: Diagnosis not present

## 2022-02-08 DIAGNOSIS — I10 Essential (primary) hypertension: Secondary | ICD-10-CM | POA: Diagnosis not present

## 2022-02-10 NOTE — Progress Notes (Signed)
Patient ID: April Chang, female    DOB: 1960-07-02, 62 y.o.   MRN: [id number]  HPI  [name] is a 62 y/o female with a history of asthma, HTN, GERD, TBI, previous tobacco use and chronic heart failure.   Echo 08/01/21 showed an EF of 60-65%  Was in the ED 01/23/22 due to cough, edema and reported weight gain. Diuretic provided.   She presents today with a chief complaint of an initial visit. Currently without any complaints and specifically denies any difficulty sleeping, dizziness, abdominal distention, palpitations, pedal edema, chest pain, shortness of breath, cough, fatigue or weight gain.   Patient has TBI so sister is guardian and patient also has a caregiver.   Past Medical History:  Diagnosis Date   Asthma    CHF (congestive heart failure) (HCC)    GERD (gastroesophageal reflux disease)    Hypertension    [facility] spotted fever    TBI (traumatic brain injury) ([place])    History reviewed. No pertinent surgical history. Family History  Problem Relation Age of Onset   Coronary artery disease Mother    Atrial fibrillation Father    Breast cancer Maternal Aunt    Social History   Tobacco Use   Smoking status: Former    Packs/day: 0.50    Types: Cigarettes    Quit date: 1990    Years since quitting: 34.0   Smokeless tobacco: Not on file  Substance Use Topics   Alcohol use: Not Currently    Comment: Former alcoholic   No Known Allergies Prior to Admission medications   Medication Sig Start Date End Date Taking? Authorizing Provider  clobetasol (TEMOVATE) 0.05 % external solution To affected areas of scalp QD PRN up to 5d/wk. Avoid applying to face, groin, and axilla. 07/30/21  Yes Ralene Bathe, MD  Docusate Calcium (STOOL SOFTENER PO) Take 1 capsule by mouth every other day.   Yes [provider]  donepezil (ARICEPT) 5 MG tablet Take 5 mg by mouth at bedtime.   Yes [provider]  escitalopram (LEXAPRO) 10 MG tablet Take 10 mg by  mouth daily. 07/25/21  Yes [provider]  fluticasone (FLONASE) 50 MCG/ACT nasal spray Place 1 spray into both nostrils daily. 04/02/21  Yes [provider]  furosemide (LASIX) 40 MG tablet Take 1 tablet (40 mg total) by mouth daily as needed. Patient taking differently: Take 40 mg by mouth daily. 12/05/21  Yes End, Harrell Gave, MD  gabapentin (NEURONTIN) 100 MG capsule Take 100 mg by mouth 2 (two) times daily.   Yes [provider]  levocetirizine (XYZAL) 5 MG tablet Take 5 mg by mouth daily. 06/30/21  Yes [provider]  lidocaine 4 % Place 1 patch onto the skin as directed. 05/09/21  Yes [provider]  montelukast (SINGULAIR) 10 MG tablet Take 10 mg by mouth daily. 07/03/21  Yes [provider]  Multiple Vitamin (MULTIVITAMIN) tablet Take 1 tablet by mouth daily.   Yes [provider]  omeprazole (PRILOSEC) 20 MG capsule Take 20 mg by mouth daily. 07/13/21  Yes [provider]  ondansetron (ZOFRAN-ODT) 4 MG disintegrating tablet Take 1 tablet (4 mg total) by mouth every 8 (eight) hours as needed for nausea or vomiting. 01/23/22  Yes Blake Divine, MD  Tapinarof (VTAMA) 1 % CREA APPLY TO AFFECTED AREA OF PSORIASIS TOPICALLY ONCE EVERY DAY 07/30/21  Yes Ralene Bathe, MD  VENTOLIN HFA 108 [phone] Base) MCG/ACT inhaler Inhale 2 puffs into the  lungs every 4 (four) hours as needed. 11/09/21  Yes [provider]   Review of Systems  Constitutional:  Negative for appetite change and fatigue.  HENT:  Negative for congestion, postnasal drip and sore throat.   Eyes: Negative.   Respiratory:  Negative for cough, chest tightness and shortness of breath.   Cardiovascular:  Negative for chest pain and leg swelling.  Gastrointestinal:  Negative for abdominal distention and abdominal pain.  Endocrine: Negative.   Genitourinary: Negative.   Musculoskeletal:  Negative for back pain and neck pain.  Skin: Negative.    Allergic/Immunologic: Negative.   Neurological:  Negative for dizziness and light-headedness.  Hematological:  Negative for adenopathy. Does not bruise/bleed easily.  Psychiatric/Behavioral:  Negative for dysphoric mood and sleep disturbance (sleeping on 1-2 pillows). The patient is not nervous/anxious.    Vitals:   02/11/22 1031  BP: (!) 140/80  Pulse: 86  SpO2: 99%  Weight: 167 lb 3.2 oz (75.8 kg)   Wt Readings from Last 3 Encounters:  02/11/22 167 lb 3.2 oz (75.8 kg)  12/05/21 164 lb (74.4 kg)  09/04/21 163 lb 3.2 oz (74 kg)   Lab Results  Component Value Date   CREATININE 0.71 01/23/2022   CREATININE 0.91 08/14/2021   CREATININE 0.75 07/27/2021   Physical Exam Vitals and nursing note reviewed. Exam conducted with a chaperone present (sister & caregiver).  Constitutional:      Appearance: Normal appearance.  HENT:     Head: Normocephalic and atraumatic.  Cardiovascular:     Rate and Rhythm: Normal rate and regular rhythm.  Pulmonary:     Effort: No respiratory distress.     Breath sounds: No wheezing, rhonchi or rales.  Abdominal:     General: There is no distension.     Palpations: Abdomen is soft.     Tenderness: There is no abdominal tenderness.  Musculoskeletal:        General: No tenderness.     Cervical back: Normal range of motion and neck supple.     Right lower leg: No edema.     Left lower leg: No edema.  Skin:    General: Skin is warm and dry.  Neurological:     Mental Status: She is alert and oriented to person, place, and time. Mental status is at baseline.  Psychiatric:        Mood and Affect: Mood normal.        Behavior: Behavior normal.   Assessment & Plan:  1: Chronic heart failure with preserved ejection fraction without LVH/LAE- - NYHA class I - euvolemic  - weighing daily; reminded sister to call for an overnight weight gain of > 2 pounds or a weekly weight gain of > 5 pounds - not adding salt and uses salt substitute only - saw  cardiology (End) 12/05/21 - BNP 07/27/21 was 15/3  2: HTN- - BP 140/80 - sees PCP @ [place] 01/23/22 showed sodium 139, potassium 3.5, creatinine 0.71 and GFR >60  3: TBI- - sister is caregiver - saw neurology [place] Nakayama) 12/19/21   Medication list reviewed.   Return in 6 weeks, sooner if needed.

## 2022-02-11 ENCOUNTER — Encounter: Payer: Self-pay | Admitting: Family

## 2022-02-11 ENCOUNTER — Ambulatory Visit: Payer: Medicaid Other | Attending: Family | Admitting: Family

## 2022-02-11 ENCOUNTER — Other Ambulatory Visit: Payer: Medicaid Other | Admitting: *Deleted

## 2022-02-11 VITALS — BP 140/80 | HR 86 | Wt 167.2 lb

## 2022-02-11 DIAGNOSIS — I5032 Chronic diastolic (congestive) heart failure: Secondary | ICD-10-CM

## 2022-02-11 DIAGNOSIS — I10 Essential (primary) hypertension: Secondary | ICD-10-CM | POA: Diagnosis not present

## 2022-02-11 DIAGNOSIS — K219 Gastro-esophageal reflux disease without esophagitis: Secondary | ICD-10-CM | POA: Diagnosis not present

## 2022-02-11 DIAGNOSIS — Z87891 Personal history of nicotine dependence: Secondary | ICD-10-CM | POA: Diagnosis not present

## 2022-02-11 DIAGNOSIS — I110 Hypertensive heart disease with heart failure: Secondary | ICD-10-CM | POA: Insufficient documentation

## 2022-02-11 DIAGNOSIS — Z87820 Personal history of traumatic brain injury: Secondary | ICD-10-CM | POA: Diagnosis not present

## 2022-02-11 DIAGNOSIS — J45909 Unspecified asthma, uncomplicated: Secondary | ICD-10-CM | POA: Diagnosis not present

## 2022-02-11 NOTE — Patient Instructions (Signed)
Visit Information  Ms. Isyss Urey  - as a part of your Medicaid benefit, you are eligible for care management and care coordination services at no cost or copay. I was unable to reach you by phone today but would be happy to help you with your health related needs. Please feel free to call me @ [phone].   A member of the Managed Medicaid care management team will reach out to you again over the next 14 days.   Lurena Joiner RN, BSN [place]  [employer] manager

## 2022-02-11 NOTE — Patient Instructions (Signed)
Continue weighing daily and call for an overnight weight gain of 3 pounds or more or a weekly weight gain of more than 5 pounds.   If you have voicemail, please make sure your mailbox is cleaned out so that we may leave a message and please make sure to listen to any voicemails.     

## 2022-02-11 NOTE — Patient Outreach (Signed)
  Medicaid Managed Care   Unsuccessful Attempt Note   02/11/2022 Name: April Chang MRN: [id number] DOB: Sep 06, 1960  Referred by: Center, [facility] Reason for referral : High Risk Managed Medicaid (Unsuccessful RNCM follow up telephone outreach)   An unsuccessful telephone outreach was attempted today. The patient was referred to the case management team for assistance with care management and care coordination.    Follow Up Plan: A HIPAA compliant phone message was left for the patient providing contact information and requesting a return call. and The Managed Medicaid care management team will reach out to the patient again over the next 14 days.    Lurena Joiner RN, BSN [place]  [employer] manager

## 2022-02-12 ENCOUNTER — Encounter: Payer: Self-pay | Admitting: Family

## 2022-02-12 ENCOUNTER — Other Ambulatory Visit: Payer: Self-pay | Admitting: Family

## 2022-02-12 DIAGNOSIS — I10 Essential (primary) hypertension: Secondary | ICD-10-CM | POA: Diagnosis not present

## 2022-02-12 DIAGNOSIS — Z87820 Personal history of traumatic brain injury: Secondary | ICD-10-CM

## 2022-02-13 ENCOUNTER — Encounter: Payer: Self-pay | Admitting: Dermatology

## 2022-02-13 DIAGNOSIS — I10 Essential (primary) hypertension: Secondary | ICD-10-CM | POA: Diagnosis not present

## 2022-02-14 ENCOUNTER — Telehealth: Payer: Self-pay

## 2022-02-14 DIAGNOSIS — I10 Essential (primary) hypertension: Secondary | ICD-10-CM | POA: Diagnosis not present

## 2022-02-14 NOTE — Telephone Encounter (Signed)
..  Medicaid Managed Care   Unsuccessful Outreach Note  02/14/2022 Name: April Chang MRN: [id number] DOB: [birth year]  Referred by: Center, [facility] Reason for referral : Appointment (Called to reschedule her missed phone appt with the MM RNCM.)   A second unsuccessful telephone outreach was attempted today. The patient was referred to the case management team for assistance with care management and care coordination.   Follow Up Plan: The care management team will reach out to the patient again over the next 7 days.   [place]

## 2022-02-18 DIAGNOSIS — I10 Essential (primary) hypertension: Secondary | ICD-10-CM | POA: Diagnosis not present

## 2022-02-19 DIAGNOSIS — I10 Essential (primary) hypertension: Secondary | ICD-10-CM | POA: Diagnosis not present

## 2022-02-20 DIAGNOSIS — I10 Essential (primary) hypertension: Secondary | ICD-10-CM | POA: Diagnosis not present

## 2022-02-21 DIAGNOSIS — I10 Essential (primary) hypertension: Secondary | ICD-10-CM | POA: Diagnosis not present

## 2022-02-22 DIAGNOSIS — I10 Essential (primary) hypertension: Secondary | ICD-10-CM | POA: Diagnosis not present

## 2022-02-24 ENCOUNTER — Encounter: Payer: Self-pay | Admitting: Family

## 2022-02-25 DIAGNOSIS — I10 Essential (primary) hypertension: Secondary | ICD-10-CM | POA: Diagnosis not present

## 2022-02-26 DIAGNOSIS — I10 Essential (primary) hypertension: Secondary | ICD-10-CM | POA: Diagnosis not present

## 2022-02-27 DIAGNOSIS — I10 Essential (primary) hypertension: Secondary | ICD-10-CM | POA: Diagnosis not present

## 2022-02-28 DIAGNOSIS — N393 Stress incontinence (female) (male): Secondary | ICD-10-CM | POA: Diagnosis not present

## 2022-02-28 DIAGNOSIS — I10 Essential (primary) hypertension: Secondary | ICD-10-CM | POA: Diagnosis not present

## 2022-03-04 DIAGNOSIS — I10 Essential (primary) hypertension: Secondary | ICD-10-CM | POA: Diagnosis not present

## 2022-03-05 DIAGNOSIS — I10 Essential (primary) hypertension: Secondary | ICD-10-CM | POA: Diagnosis not present

## 2022-03-06 ENCOUNTER — Other Ambulatory Visit: Payer: Medicaid Other | Admitting: *Deleted

## 2022-03-06 ENCOUNTER — Encounter: Payer: Self-pay | Admitting: *Deleted

## 2022-03-06 DIAGNOSIS — I10 Essential (primary) hypertension: Secondary | ICD-10-CM | POA: Diagnosis not present

## 2022-03-06 NOTE — Patient Outreach (Signed)
Medicaid Managed Care   Nurse Care Manager Note  03/06/2022 Name:  April Chang MRN:  [id number] DOB:  Dec 06, 1960  April Chang is an 62 y.o. year old female who is a primary patient of [facility] [employer].  The [facility] Managed Care Coordination team was consulted for assistance with:    CHF TBI  Ms. April Chang was given information about Medicaid Managed Care Coordination team services today. April Chang Legal Guardian agreed to services and verbal consent obtained.  Engaged with patient by telephone for follow up visit in response to provider referral for case management and/or care coordination services.   Assessments/Interventions:  Review of past medical history, allergies, medications, health status, including review of consultants reports, laboratory and other test data, was performed as part of comprehensive evaluation and provision of chronic care management services.  SDOH (Social Determinants of Health) assessments and interventions performed: SDOH Interventions    Flowsheet Row Patient Outreach Telephone from 12/11/2021 in [place] Patient Outreach Telephone from 10/30/2021 in [place] Coordination  SDOH Interventions    Food Insecurity Interventions Intervention Not Indicated --  Housing Interventions -- Intervention Not Indicated  Transportation Interventions -- Intervention Not Indicated  Utilities Interventions Intervention Not Indicated --       Care Plan  No Known Allergies  Medications Reviewed Today     Reviewed by Melissa Montane, RN (Registered Nurse) on 03/06/22 at 81  Med List Status: <None>   Medication Order Taking? Sig Documenting Provider Last Dose Status Informant  clobetasol (TEMOVATE) 0.05 % external solution [id number] Yes To affected areas of scalp QD PRN up to 5d/wk. Avoid applying to face, groin, and axilla. Ralene Bathe, MD Taking Active    Docusate Calcium (STOOL SOFTENER PO) [id number] Yes Take 1 capsule by mouth every other day. [provider] Taking Active   donepezil (ARICEPT) 5 MG tablet [id number] Yes Take 5 mg by mouth at bedtime. [provider] Taking Active   escitalopram (LEXAPRO) 10 MG tablet [id number] Yes Take 10 mg by mouth daily. [provider] Taking Active   fluticasone (FLONASE) 50 MCG/ACT nasal spray [id number] Yes Place 1 spray into both nostrils daily. [provider] Taking Active   furosemide (LASIX) 40 MG tablet [id number] Yes Take 1 tablet (40 mg total) by mouth daily as needed.  Patient taking differently: Take 40 mg by mouth daily.   End, Harrell Gave, MD Taking Active   gabapentin (NEURONTIN) 100 MG capsule [id number] Yes Take 100 mg by mouth 2 (two) times daily. [provider] Taking Active   levocetirizine (XYZAL) 5 MG tablet [id number] Yes Take 5 mg by mouth daily. [provider] Taking Active   lidocaine 4 % [id number] Yes Place 1 patch onto the skin as directed. [provider] Taking Active   montelukast (SINGULAIR) 10 MG tablet [id number] Yes Take 10 mg by mouth daily. [provider] Taking Active   Multiple Vitamin (MULTIVITAMIN) tablet [id number] Yes Take 1 tablet by mouth daily. [provider] Taking Active   omeprazole (PRILOSEC) 20 MG capsule [id number] Yes Take 20 mg by mouth daily. [provider] Taking Active   ondansetron (ZOFRAN-ODT) 4 MG disintegrating tablet [id number] No Take 1 tablet (4 mg total) by mouth every 8 (eight) hours as needed for nausea or vomiting.  Patient not taking: Reported on 03/06/2022   Blake Divine, MD Not Taking Active   Tapinarof [facility]) 1 % CREA [id number] Yes APPLY TO AFFECTED AREA OF PSORIASIS  TOPICALLY ONCE EVERY DAY Ralene Bathe, MD Taking Active   VENTOLIN HFA 108 [phone] Base) MCG/ACT inhaler [id number] Yes Inhale 2 puffs into the lungs every 4 (four) hours as needed.  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 ([place]) 04/24/2014   Anxiety about health 03/14/2014   B12 deficiency 01/04/2014   Difficulty walking 01/04/2014   Palpitations 01/04/2014   DDD (degenerative disc disease), lumbar 06/07/2013   Lumbar radiculitis 06/07/2013   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    Conditions to be addressed/monitored per PCP order:  CHF and TBI  Care Plan : RN Care Manager Plan of Care  Updates made by Melissa Montane, RN since 03/06/2022 12:00 AM     Problem: Health Management needs related to CHF and TBI      Long-Range Goal: Development of Plan of Care to address Health Management needs related to CHF and TBI   Start Date: 10/30/2021  Expected End Date: 04/19/2022  Priority: High  Note:   Current Barriers:  Chronic Disease Management support and education needs related to CHF and TBI Patient has attended Neuro Evaluation, HF Clinic visit, Eye exam and has Neuro PT evaluation scheduled. Patient's sister is concerned about decreased energy and sleeping more frequently. Patient receiving PCS 3 days a week.  RNCM Clinical Goal(s):  Patient's legal guardian will verbalize understanding of plan for management of CHF and TBI as evidenced by Patient's legal guardian reports take all medications exactly as prescribed and will call provider for  medication related questions as evidenced by verbal report and EMR documentation    attend all scheduled medical appointments: HF Clinic 03/25/22, Neuro PT eval 03/29/22, Neurology follow up 03/20/22  as evidenced by provider documentation        continue to work with [place] and/or Social Worker to address care management and care coordination needs related to CHF and TBI as evidenced by adherence to CM Team Scheduled appointments     through collaboration with Consulting civil engineer, provider, and care team.   Interventions: Inter-disciplinary care team collaboration (see longitudinal plan of care) Evaluation of current treatment plan related to  self management and patient's adherence to plan as established by provider   Heart Failure Interventions:  (Status: Goal on Track (progressing): YES.)  Long Term Goal  Wt Readings from Last 3 Encounters:  02/11/22 167 lb 3.2 oz (75.8 kg)  12/05/21 164 lb (74.4 kg)  09/04/21 163 lb 3.2 oz (74 kg)   Discussed the importance of keeping all appointments with provider Provided patient with education about the role of exercise in the management of heart failure Assessed social determinant of health barriers Advised to contact HF provider with any concerns or questions Reviewed and discussed provider notes  Traumatic Brain Injury  (Status: Goal on Track (progressing): YES.) Long Term Goal  Evaluation of  current treatment plan related to  TBI ,  self-management and patient's adherence to plan as established by provider. Discussed plans with patient for ongoing care management follow up and provided patient with direct contact information for care management team Reviewed medications with patient and discussed having all medications and the importance of taking as directed, updates made in EMR; Assessed social determinant of health barriers;  Discussed eye exam on 02/25/22, waiting on new glasses Discussed Neurology Evaluation and reviewed provider  note  Patient Goals/Self-Care Activities: Take medications as prescribed   Attend all scheduled provider appointments Call provider office for new concerns or questions  call office if I gain more than 2 pounds in one day or 5 pounds in one week use salt in moderation weigh myself daily eat more whole grains, fruits and vegetables, lean meats and healthy fats       Follow Up:  Patient agrees to Care Plan and Follow-up.  Plan: The Managed Medicaid care management team will reach out to the patient again over the next 30 days.  Date/time of next scheduled RN care management/care coordination outreach:  04/15/22 @ 3:30pm  Lurena Joiner RN, BSN [place] RN Care Coordinator

## 2022-03-06 NOTE — Patient Instructions (Signed)
Visit Information  April Chang was given information about Medicaid Managed Care team care coordination services as a part of their [facility] Medicaid benefit. April Chang verbally consented to engagement with the [facility] Managed Care team.   If you are experiencing a medical emergency, please call 911 or report to your local emergency department or urgent care.   If you have a non-emergency medical problem during routine business hours, please contact your provider's office and ask to speak with a nurse.   For questions related to your [facility] health plan, please call: [phone] or go here:[email or web address]  If you would like to schedule transportation through your [facility] plan, please call the following number at least 2 days in advance of your appointment: [phone].  You can also use the MTM portal or MTM mobile app to manage your rides. For the portal, please go to [email or web address].  Call the College at [phone], at any time, 24 hours a day, 7 days a week. If you are in danger or need immediate medical attention call 911.  If you would like help to quit smoking, call 1-800-QUIT-NOW 343-476-0247) OR Espaol: 1-855-Djelo-Ya HD:1601594) o para ms informacin haga clic aqu or Text READY to 200-400 to register via text  April Chang,   Please see education materials related to TBI provided by MyChart link.  Patient verbalizes understanding of instructions and care plan provided today and agrees to view in [place]. Active MyChart status and patient understanding of how to access instructions and care plan via MyChart confirmed with patient.     Telephone follow up appointment with Managed Medicaid care management team member scheduled for:04/15/22 @ 3:30pm  April Joiner RN, BSN [place] RN Care Coordinator   Following is a copy of your plan of care:  Care Plan : RN Care  Manager Plan of Care  Updates made by Melissa Montane, RN since 03/06/2022 12:00 AM     Problem: Health Management needs related to CHF and TBI      Long-Range Goal: Development of Plan of Care to address Health Management needs related to CHF and TBI   Start Date: 10/30/2021  Expected End Date: 04/19/2022  Priority: High  Note:   Current Barriers:  Chronic Disease Management support and education needs related to CHF and TBI Patient has attended Neuro Evaluation, HF Clinic visit, Eye exam and has Neuro PT evaluation scheduled. Patient's sister is concerned about decreased energy and sleeping more frequently. Patient receiving PCS 3 days a week.  RNCM Clinical Goal(s):  Patient's legal guardian will verbalize understanding of plan for management of CHF and TBI as evidenced by Patient's legal guardian reports take all medications exactly as prescribed and will call provider for medication related questions as evidenced by verbal report and EMR documentation    attend all scheduled medical appointments: HF Clinic 03/25/22, Neuro PT eval 03/29/22, Neurology follow up 03/20/22  as evidenced by provider documentation        continue to work with [place] and/or Social Worker to address care management and care coordination needs related to CHF and TBI as evidenced by adherence to CM Team Scheduled appointments     through collaboration with Consulting civil engineer, provider, and care team.   Interventions: Inter-disciplinary care team collaboration (see longitudinal plan of care) Evaluation of current treatment plan related to  self management and patient's adherence to plan as established by provider   Heart Failure Interventions:  (Status: Goal  on Track (progressing): YES.)  Long Term Goal  Wt Readings from Last 3 Encounters:  02/11/22 167 lb 3.2 oz (75.8 kg)  12/05/21 164 lb (74.4 kg)  09/04/21 163 lb 3.2 oz (74 kg)   Discussed the importance of keeping all appointments with provider Provided  patient with education about the role of exercise in the management of heart failure Assessed social determinant of health barriers Advised to contact HF provider with any concerns or questions Reviewed and discussed provider notes  Traumatic Brain Injury  (Status: Goal on Track (progressing): YES.) Long Term Goal  Evaluation of current treatment plan related to  TBI ,  self-management and patient's adherence to plan as established by provider. Discussed plans with patient for ongoing care management follow up and provided patient with direct contact information for care management team Reviewed medications with patient and discussed having all medications and the importance of taking as directed, updates made in EMR; Assessed social determinant of health barriers;  Discussed eye exam on 02/25/22, waiting on new glasses Discussed Neurology Evaluation and reviewed provider note  Patient Goals/Self-Care Activities: Take medications as prescribed   Attend all scheduled provider appointments Call provider office for new concerns or questions  call office if I gain more than 2 pounds in one day or 5 pounds in one week use salt in moderation weigh myself daily eat more whole grains, fruits and vegetables, lean meats and healthy fats

## 2022-03-07 DIAGNOSIS — I10 Essential (primary) hypertension: Secondary | ICD-10-CM | POA: Diagnosis not present

## 2022-03-08 ENCOUNTER — Encounter: Payer: Self-pay | Admitting: Family

## 2022-03-11 DIAGNOSIS — I10 Essential (primary) hypertension: Secondary | ICD-10-CM | POA: Diagnosis not present

## 2022-03-25 ENCOUNTER — Encounter: Payer: Self-pay | Admitting: Family

## 2022-03-25 ENCOUNTER — Ambulatory Visit: Payer: Medicaid Other | Attending: Family | Admitting: Family

## 2022-03-25 VITALS — BP 130/87 | HR 86 | Resp 16 | Wt 165.5 lb

## 2022-03-25 DIAGNOSIS — I10 Essential (primary) hypertension: Secondary | ICD-10-CM

## 2022-03-25 DIAGNOSIS — R002 Palpitations: Secondary | ICD-10-CM | POA: Insufficient documentation

## 2022-03-25 DIAGNOSIS — Z87820 Personal history of traumatic brain injury: Secondary | ICD-10-CM | POA: Diagnosis not present

## 2022-03-25 DIAGNOSIS — R42 Dizziness and giddiness: Secondary | ICD-10-CM | POA: Diagnosis not present

## 2022-03-25 DIAGNOSIS — I110 Hypertensive heart disease with heart failure: Secondary | ICD-10-CM | POA: Insufficient documentation

## 2022-03-25 DIAGNOSIS — I5032 Chronic diastolic (congestive) heart failure: Secondary | ICD-10-CM | POA: Diagnosis not present

## 2022-03-25 DIAGNOSIS — M25561 Pain in right knee: Secondary | ICD-10-CM | POA: Insufficient documentation

## 2022-03-25 DIAGNOSIS — Z8249 Family history of ischemic heart disease and other diseases of the circulatory system: Secondary | ICD-10-CM | POA: Insufficient documentation

## 2022-03-25 DIAGNOSIS — Z79899 Other long term (current) drug therapy: Secondary | ICD-10-CM | POA: Insufficient documentation

## 2022-03-25 NOTE — Progress Notes (Signed)
Patient ID: April Chang, female    DOB: Oct 15, 1960, 62 y.o.   MRN: [id number]  HPI  April Chang is a 62 y/o female with a history of asthma, HTN, GERD, TBI, previous tobacco use and chronic heart failure.   Echo 08/01/21 showed an EF of 60-65%  Was in the ED 01/23/22 due to cough, edema and reported weight gain. Diuretic provided.   She presents today for a HF follow-up visit with a chief complaint of occasional palpitations. Has been chronic and intermittent. Has associated light-headedness and right knee pain along with this. Denies any difficulty sleeping, abdominal distention, pedal edema, chest pain, SOB, cough, fatigue or weight gain.   Will be starting PT on her knee possibly this week.   Patient has TBI so sister is guardian and patient also has a caregiver.   Past Medical History:  Diagnosis Date   Asthma    CHF (congestive heart failure) (HCC)    GERD (gastroesophageal reflux disease)    Hypertension    [facility] spotted fever    TBI (traumatic brain injury) ([place])    No past surgical history on file. Family History  Problem Relation Age of Onset   Coronary artery disease Mother    Atrial fibrillation Father    Breast cancer Maternal Aunt    Social History   Tobacco Use   Smoking status: Former    Packs/day: 0.50    Types: Cigarettes    Quit date: 1990    Years since quitting: 34.1   Smokeless tobacco: Not on file  Substance Use Topics   Alcohol use: Not Currently    Comment: Former alcoholic   No Known Allergies  Prior to Admission medications   Medication Sig Start Date End Date Taking? Authorizing Provider  aspirin EC 81 MG tablet Take 81 mg by mouth daily. Swallow whole.   Yes [provider]  clobetasol (TEMOVATE) 0.05 % external solution To affected areas of scalp QD PRN up to 5d/wk. Avoid applying to face, groin, and axilla. 07/30/21  Yes Ralene Bathe, MD  donepezil (ARICEPT) 5 MG tablet Take 5 mg by mouth at bedtime.   Yes  [provider]  escitalopram (LEXAPRO) 10 MG tablet Take 10 mg by mouth daily. 07/25/21  Yes [provider]  fluticasone (FLONASE) 50 MCG/ACT nasal spray Place 1 spray into both nostrils daily. 04/02/21  Yes [provider]  furosemide (LASIX) 40 MG tablet Take 1 tablet (40 mg total) by mouth daily as needed. Patient taking differently: Take 40 mg by mouth daily. 12/05/21  Yes End, Harrell Gave, MD  gabapentin (NEURONTIN) 100 MG capsule Take 100 mg by mouth 2 (two) times daily.   Yes [provider]  levocetirizine (XYZAL) 5 MG tablet Take 5 mg by mouth daily. 06/30/21  Yes [provider]  lidocaine 4 % Place 1 patch onto the skin as directed. 05/09/21  Yes [provider]  montelukast (SINGULAIR) 10 MG tablet Take 10 mg by mouth daily. 07/03/21  Yes [provider]  Multiple Vitamin (MULTIVITAMIN) tablet Take 1 tablet by mouth daily.   Yes [provider]  omeprazole (PRILOSEC) 20 MG capsule Take 20 mg by mouth daily. 07/13/21  Yes [provider]  ondansetron (ZOFRAN-ODT) 4 MG disintegrating tablet Take 1 tablet (4 mg total) by mouth every 8 (eight) hours as needed for nausea or vomiting. 01/23/22  Yes Blake Divine, MD  Tapinarof (VTAMA) 1 % CREA APPLY TO AFFECTED AREA OF PSORIASIS TOPICALLY ONCE  EVERY DAY 07/30/21  Yes Ralene Bathe, MD  VENTOLIN HFA 108 [phone] Base) MCG/ACT inhaler Inhale 2 puffs into the lungs every 4 (four) hours as needed. 11/09/21  Yes [provider]     Review of Systems  Constitutional:  Negative for appetite change and fatigue.  HENT:  Negative for congestion, postnasal drip and sore throat.   Eyes: Negative.   Respiratory:  Negative for cough, chest tightness and shortness of breath.   Cardiovascular:  Positive for palpitations (at times). Negative for chest pain and leg swelling.  Gastrointestinal:  Negative for abdominal distention and abdominal pain.  Endocrine: Negative.    Genitourinary: Negative.   Musculoskeletal:  Positive for arthralgias (right knee w/ brace). Negative for back pain and neck pain.  Skin: Negative.   Allergic/Immunologic: Negative.   Neurological:  Positive for light-headedness (at times). Negative for dizziness.  Hematological:  Negative for adenopathy. Does not bruise/bleed easily.  Psychiatric/Behavioral:  Negative for dysphoric mood and sleep disturbance (sleeping on 1-2 pillows). The patient is not nervous/anxious.    Vitals:   03/25/22 1019  BP: 130/87  Pulse: 86  Resp: 16  SpO2: 98%  Weight: 165 lb 8 oz (75.1 kg)   Wt Readings from Last 3 Encounters:  03/25/22 165 lb 8 oz (75.1 kg)  02/11/22 167 lb 3.2 oz (75.8 kg)  12/05/21 164 lb (74.4 kg)   Lab Results  Component Value Date   CREATININE 0.71 01/23/2022   CREATININE 0.91 08/14/2021   CREATININE 0.75 07/27/2021   Physical Exam Vitals and nursing note reviewed. Exam conducted with a chaperone present (caregiver).  Constitutional:      Appearance: Normal appearance.  HENT:     Head: Normocephalic and atraumatic.  Cardiovascular:     Rate and Rhythm: Normal rate and regular rhythm.  Pulmonary:     Effort: No respiratory distress.     Breath sounds: No wheezing, rhonchi or rales.  Abdominal:     General: There is no distension.     Palpations: Abdomen is soft.     Tenderness: There is no abdominal tenderness.  Musculoskeletal:        General: No tenderness.     Cervical back: Normal range of motion and neck supple.     Right lower leg: No edema.     Left lower leg: No edema.  Skin:    General: Skin is warm and dry.  Neurological:     Mental Status: She is alert and oriented to person, place, and time. Mental status is at baseline.  Psychiatric:        Mood and Affect: Mood normal.        Behavior: Behavior normal.   Assessment & Plan:  1: Chronic heart failure with preserved ejection fraction without LVH/LAE- - NYHA class I - euvolemic  - weighing  daily; reminded to call for an overnight weight gain of > 2 pounds or a weekly weight gain of > 5 pounds - weight down 2 pounds from last visit here 6 weeks ago - not adding salt and uses salt substitute only - saw cardiology (End) 12/05/21 - furosemide '40mg'$  daily - BNP 07/27/21 was 15/3  2: HTN- - BP 130/87 - sees PCP @ [place] 01/23/22 showed sodium 139, potassium 3.5, creatinine 0.71 and GFR >60  3: TBI- - sister is caregiver - saw neurology [place] Nakay[place]) 12/19/21   Patient did not bring her medications nor a list. Each medication was verbally reviewed with the  patient & caregiver and she was encouraged to bring the bottles to every visit to confirm accuracy of list.  Return in 3 months, sooner if needed

## 2022-03-29 ENCOUNTER — Ambulatory Visit: Payer: Medicaid Other | Admitting: Physical Therapy

## 2022-04-10 ENCOUNTER — Ambulatory Visit: Payer: Medicaid Other

## 2022-04-12 ENCOUNTER — Ambulatory Visit: Payer: Medicaid Other | Admitting: Physical Therapy

## 2022-04-15 ENCOUNTER — Other Ambulatory Visit: Payer: Medicaid Other | Admitting: *Deleted

## 2022-04-15 ENCOUNTER — Encounter: Payer: Self-pay | Admitting: *Deleted

## 2022-04-15 NOTE — Patient Outreach (Signed)
   Embedded Care Coordination  Case Closure Note  04/15/2022 Name: Myalee Tack MRN: [id number] DOB: [birth year]  Zahniyah Rightmyer is a 62 y.o. year old female who is a primary care patient of [facility] [employer]. The Embedded Care Coordination team was consulted for assistance with chronic disease management and care coordination needs related to CHF and TBI  The patient has met all care management goals, agreed to case closure, and has been provided with contact information for the care management team. Appropriate care team members and provider have been notified via electronic communication. The care management team is available to provide care management/care coordination support at any time in the future should needs arise. Further engagement requires referral order [id number]).   If patient returns call to provider office and is in need of assistance from the embedded care coordination team, please advise that the patient call the [place] at [phone] for assistance.   Lurena Joiner RN, BSN [facility] RN Care Coordinator [phone]

## 2022-04-17 ENCOUNTER — Ambulatory Visit: Payer: Medicaid Other

## 2022-04-19 ENCOUNTER — Ambulatory Visit: Payer: Medicaid Other | Admitting: Physical Therapy

## 2022-04-22 ENCOUNTER — Ambulatory Visit: Payer: Medicaid Other | Admitting: Physical Therapy

## 2022-04-26 ENCOUNTER — Ambulatory Visit: Payer: Medicaid Other | Admitting: Physical Therapy

## 2022-04-29 ENCOUNTER — Ambulatory Visit: Payer: Medicaid Other

## 2022-05-03 ENCOUNTER — Ambulatory Visit: Payer: Medicaid Other | Admitting: Physical Therapy

## 2022-05-06 ENCOUNTER — Ambulatory Visit: Payer: Medicaid Other | Admitting: Physical Therapy

## 2022-05-10 ENCOUNTER — Ambulatory Visit: Payer: Medicaid Other | Admitting: Physical Therapy

## 2022-05-13 ENCOUNTER — Ambulatory Visit: Payer: Medicaid Other | Admitting: Physical Therapy

## 2022-05-17 ENCOUNTER — Ambulatory Visit: Payer: Medicaid Other | Admitting: Physical Therapy

## 2022-05-20 ENCOUNTER — Ambulatory Visit: Payer: Medicaid Other | Admitting: Physical Therapy

## 2022-05-24 ENCOUNTER — Ambulatory Visit: Payer: Medicaid Other | Admitting: Physical Therapy

## 2022-05-27 ENCOUNTER — Ambulatory Visit: Payer: Medicaid Other | Admitting: Physical Therapy

## 2022-05-31 ENCOUNTER — Ambulatory Visit: Payer: Medicaid Other | Admitting: Physical Therapy

## 2022-06-03 ENCOUNTER — Ambulatory Visit: Payer: Medicaid Other | Admitting: Physical Therapy

## 2022-06-07 ENCOUNTER — Ambulatory Visit: Payer: Medicaid Other | Admitting: Physical Therapy

## 2022-06-10 ENCOUNTER — Ambulatory Visit: Payer: Medicaid Other | Admitting: Physical Therapy

## 2022-06-14 ENCOUNTER — Ambulatory Visit: Payer: Medicaid Other | Admitting: Physical Therapy

## 2022-06-21 ENCOUNTER — Ambulatory Visit: Payer: Medicaid Other | Admitting: Physical Therapy

## 2022-06-24 ENCOUNTER — Ambulatory Visit: Payer: Medicaid Other | Admitting: Physical Therapy

## 2022-06-25 ENCOUNTER — Encounter: Payer: Medicaid Other | Admitting: Family

## 2022-06-28 ENCOUNTER — Ambulatory Visit: Payer: Medicaid Other | Admitting: Physical Therapy

## 2022-07-01 ENCOUNTER — Ambulatory Visit: Payer: Medicaid Other | Admitting: Physical Therapy

## 2022-07-02 ENCOUNTER — Telehealth: Payer: Self-pay

## 2022-07-02 ENCOUNTER — Encounter: Payer: Self-pay | Admitting: Family

## 2022-07-02 ENCOUNTER — Other Ambulatory Visit
Admission: RE | Admit: 2022-07-02 | Discharge: 2022-07-02 | Disposition: A | Discharge status: Home / Self Care | Payer: Medicaid Other | Source: Ambulatory Visit | Attending: Family | Admitting: Family

## 2022-07-02 ENCOUNTER — Ambulatory Visit (HOSPITAL_BASED_OUTPATIENT_CLINIC_OR_DEPARTMENT_OTHER): Payer: Medicaid Other | Admitting: Family

## 2022-07-02 ENCOUNTER — Telehealth (HOSPITAL_COMMUNITY): Payer: Self-pay

## 2022-07-02 ENCOUNTER — Other Ambulatory Visit (HOSPITAL_COMMUNITY): Payer: Self-pay

## 2022-07-02 VITALS — BP 111/79 | HR 74 | Wt 160.0 lb

## 2022-07-02 DIAGNOSIS — Z87820 Personal history of traumatic brain injury: Secondary | ICD-10-CM | POA: Diagnosis not present

## 2022-07-02 DIAGNOSIS — I5032 Chronic diastolic (congestive) heart failure: Secondary | ICD-10-CM | POA: Insufficient documentation

## 2022-07-02 DIAGNOSIS — I10 Essential (primary) hypertension: Secondary | ICD-10-CM

## 2022-07-02 LAB — BASIC METABOLIC PANEL
Anion gap: 12 (ref 5–15)
BUN: 16 mg/dL (ref 8–23)
CO2: 26 mmol/L (ref 22–32)
Calcium: 9.1 mg/dL (ref 8.9–10.3)
Chloride: 101 mmol/L (ref 98–111)
Creatinine, Ser: 0.66 mg/dL (ref 0.44–1.00)
GFR, Estimated: >60 mL/min (ref >60)
Glucose, Bld: 88 mg/dL (ref 70–99)
Potassium: 3.3 mmol/L — ABNORMAL LOW (ref 3.5–5.1)
Sodium: 139 mmol/L (ref 135–145)

## 2022-07-02 MED ORDER — POTASSIUM CHLORIDE CRYS ER 20 MEQ PO TBCR: 20.0000 meq | EXTENDED_RELEASE_TABLET | Freq: Every day | ORAL | 3 refills | Status: DC

## 2022-07-02 MED ORDER — EMPAGLIFLOZIN 10 MG PO TABS: 10.0000 mg | ORAL_TABLET | Freq: Every day | ORAL | 5 refills | Status: DC

## 2022-07-02 NOTE — Telephone Encounter (Signed)
Advanced Heart Failure Patient Advocate Encounter  Prior authorization for [place] has been submitted and approved. Test billing returns $0 for 90 day supply.  [place] Tracks: [id number] W Effective: 07/02/2022 to 07/02/2023  Burnell Blanks, CPhT Rx Patient Advocate Phone: [phone]

## 2022-07-02 NOTE — Progress Notes (Signed)
PCP: [facility] (last seen Primary Cardiologist: End, Cristal Deer, MD (last seen 11/23)  HPI:  April Chang is a 62 y/o female with a history of asthma, HTN, GERD, TBI, previous tobacco use and chronic heart failure.   Echo 08/01/21 showed an EF of 60-65%  Was in the ED 01/23/22 due to cough, edema and reported weight gain. Diuretic provided.   She presents today for a HF follow-up visit with a chief complaint of minimal SOB with moderate exertion. Occurs on an intermittent basis. Has associated wheezing, cough, and edema in feet at the end of the day. Reports that her swelling resolves overnight after she's had her feet in the bed. Has bilateral knee braces on from [employer] due to weakness in the legs and is waiting on professional ones once her medicaid gets straightened out. She denies chest pain, palpitations, dizziness or difficulty sleeping.   Patient has TBI so sister is guardian and patient also has a caregiver.   ROS: All systems negative except as listed in HPI, PMH and Problem List.  SH:  Social History   Socioeconomic History   Marital status: Single    Spouse name: Not on file   Number of children: Not on file   Years of education: Not on file   Highest education level: Not on file  Occupational History   Not on file  Tobacco Use   Smoking status: Former    Packs/day: .5    Types: Cigarettes    Quit date: 5    Years since quitting: 34.4   Smokeless tobacco: Not on file  Vaping Use   Vaping Use: Never used  Substance and Sexual Activity   Alcohol use: Not Currently    Comment: Former alcoholic   Drug use: Not Currently    Types: Cocaine, Marijuana    Comment: many years ago   Sexual activity: Not on file  Other Topics Concern   Not on file  Social History Narrative   Not on file   Social Determinants of Health   Financial Resource Strain: Not on file  Food Insecurity: No Food Insecurity (12/11/2021)   Hunger Vital Sign    Worried About  Running Out of Food in the Last Year: Never true    Ran Out of Food in the Last Year: Never true  Transportation Needs: No Transportation Needs (10/30/2021)   PRAPARE - Administrator, Civil Service (Medical): No    Lack of Transportation (Non-Medical): No  Physical Activity: Not on file  Stress: Not on file  Social Connections: Not on file  Intimate Partner Violence: Not on file    FH:  Family History  Problem Relation Age of Onset   Coronary artery disease Mother    Atrial fibrillation Father    Breast cancer Maternal Aunt     Past Medical History:  Diagnosis Date   Asthma    CHF (congestive heart failure) (HCC)    GERD (gastroesophageal reflux disease)    Hypertension    Rocky Mountain spotted fever    TBI (traumatic brain injury) (HCC)     Current Outpatient Medications  Medication Sig Dispense Refill   aspirin EC 81 MG tablet Take 81 mg by mouth daily. Swallow whole.     clobetasol (TEMOVATE) 0.05 % external solution To affected areas of scalp QD PRN up to 5d/wk. Avoid applying to face, groin, and axilla. 50 mL 5   donepezil (ARICEPT) 5 MG tablet Take 5 mg by mouth at bedtime.  escitalopram (LEXAPRO) 10 MG tablet Take 10 mg by mouth daily.     fluticasone (FLONASE) 50 MCG/ACT nasal spray Place 1 spray into both nostrils daily.     furosemide (LASIX) 40 MG tablet Take 1 tablet (40 mg total) by mouth daily as needed. (Patient taking differently: Take 40 mg by mouth daily.) 30 tablet 10   gabapentin (NEURONTIN) 100 MG capsule Take 100 mg by mouth 2 (two) times daily.     levocetirizine (XYZAL) 5 MG tablet Take 5 mg by mouth daily.     lidocaine 4 % Place 1 patch onto the skin as directed.     montelukast (SINGULAIR) 10 MG tablet Take 10 mg by mouth daily.     Multiple Vitamin (MULTIVITAMIN) tablet Take 1 tablet by mouth daily.     omeprazole (PRILOSEC) 20 MG capsule Take 20 mg by mouth daily.     ondansetron (ZOFRAN-ODT) 4 MG disintegrating tablet Take 1  tablet (4 mg total) by mouth every 8 (eight) hours as needed for nausea or vomiting. (Patient not taking: Reported on 04/15/2022) 12 tablet 0   Tapinarof (VTAMA) 1 % CREA APPLY TO AFFECTED AREA OF PSORIASIS TOPICALLY ONCE EVERY DAY 60 g 6   VENTOLIN HFA 108 (90 Base) MCG/ACT inhaler Inhale 2 puffs into the lungs every 4 (four) hours as needed.     No current facility-administered medications for this visit.   Vitals:   07/02/22 1158  BP: 111/79  Pulse: 74  SpO2: 98%  Weight: 160 lb (72.6 kg)   Wt Readings from Last 3 Encounters:  07/02/22 160 lb (72.6 kg)  03/25/22 165 lb 8 oz (75.1 kg)  02/11/22 167 lb 3.2 oz (75.8 kg)   Lab Results  Component Value Date   CREATININE 0.71 01/23/2022   CREATININE 0.91 08/14/2021   CREATININE 0.75 07/27/2021   PHYSICAL EXAM:  General:  Well appearing. No resp difficulty HEENT: normal Neck: supple. JVP flat. No lymphadenopathy or thryomegaly appreciated. Cor: PMI normal. Regular rate & rhythm. No rubs, gallops or murmurs. Lungs: clear Abdomen: soft, nontender, nondistended. No hepatosplenomegaly. No bruits or masses.  Extremities: no cyanosis, clubbing, rash, trace pitting edema bilaterally Neuro: alert & oriented x3, cranial nerves grossly intact. Has bilateral knee braces on. Affect pleasant.   ECG: not done   ASSESSMENT & PLAN:  1: NICM with preserved ejection fraction- - likely due to HTN - NYHA class II - euvolemic  - weighing daily; reminded to call for an overnight weight gain of > 2 pounds or a weekly weight gain of > 5 pounds - weight down 5 pounds from last visit here 3 months ago - Echo 08/01/21 showed an EF of 60-65% - not adding salt and uses salt substitute only - saw cardiology (End) 11/23 - continue furosemide 40mg  daily - begin jardiance 10mg  daily; 30 day voucher provided - BMP next visit - if urination increases, may be able to decrease furosemide or use it PRN; will not decrease it today as she gets pedal edema as  the day progresses - encouraged leg elevation when sitting for long periods of time - BNP 07/27/21 was 15/3  2: HTN- - BP 111/79 - sees PCP @ [facility] - BMP 01/23/22 showed sodium 139, potassium 3.5, creatinine 0.71 and GFR >60 - BMP today  3: TBI- - sister is caregiver - saw neurology Marlene Bast) 02/24 - getting physical therapy  Return in 1 month, sooner if needed.

## 2022-07-02 NOTE — Patient Instructions (Addendum)
[facility] to get your lab work drawn.    Start the jardiance as 1 tablet every morning.

## 2022-07-02 NOTE — Telephone Encounter (Signed)
  Spoke with pt regarding [name], FNP recommendations. Pt aware, agreeable, and verbalized understanding .  Potassium 20 mEq orders placed.    [name], FNP      Kidney function looks good. Potassium is a little low so begin potassium every morning.

## 2022-07-05 ENCOUNTER — Ambulatory Visit: Payer: Medicaid Other | Admitting: Physical Therapy

## 2022-07-08 ENCOUNTER — Ambulatory Visit: Payer: Medicaid Other | Admitting: Physical Therapy

## 2022-07-10 ENCOUNTER — Encounter: Payer: Self-pay | Admitting: Family

## 2022-07-12 ENCOUNTER — Ambulatory Visit: Payer: Medicaid Other | Admitting: Physical Therapy

## 2022-07-15 ENCOUNTER — Ambulatory Visit: Payer: Medicaid Other | Admitting: Physical Therapy

## 2022-07-31 ENCOUNTER — Other Ambulatory Visit: Payer: Self-pay | Admitting: Dermatology

## 2022-07-31 DIAGNOSIS — L409 Psoriasis, unspecified: Secondary | ICD-10-CM

## 2022-08-01 ENCOUNTER — Telehealth: Payer: Self-pay | Admitting: Family

## 2022-08-01 ENCOUNTER — Encounter: Payer: Medicaid Other | Admitting: Family

## 2022-08-01 NOTE — Telephone Encounter (Signed)
Patient did not show for her [facility] appointment on 08/01/22.  

## 2022-08-01 NOTE — Progress Notes (Deleted)
PCP: [facility] (last seen Primary Cardiologist: End, Cristal Deer, MD (last seen 11/23)  HPI:  [name] is a 62 y/o female with a history of asthma, HTN, GERD, TBI, previous tobacco use and chronic heart failure.   Echo 08/01/21 showed an EF of 60-65%  Was in the ED 01/23/22 due to cough, edema and reported weight gain. Diuretic provided.   She presents today for a HF follow-up visit with a chief complaint of minimal SOB with moderate exertion. Occurs on an intermittent basis. Has associated wheezing, cough, and edema in feet at the end of the day. Reports that her swelling resolves overnight after she's had her feet in the bed. Has bilateral knee braces on from [employer] due to weakness in the legs and is waiting on professional ones once her medicaid gets straightened out. She denies chest pain, palpitations, dizziness or difficulty sleeping.   Patient has TBI so sister is guardian and patient also has a caregiver.   ROS: All systems negative except as listed in HPI, PMH and Problem List.  SH:  Social History   Socioeconomic History   Marital status: Single    Spouse name: Not on file   Number of children: Not on file   Years of education: Not on file   Highest education level: Not on file  Occupational History   Not on file  Tobacco Use   Smoking status: Former    Current packs/day: 0.00    Types: Cigarettes    Quit date: 70    Years since quitting: 34.5   Smokeless tobacco: Not on file  Vaping Use   Vaping status: Never Used  Substance and Sexual Activity   Alcohol use: Not Currently    Comment: Former alcoholic   Drug use: Not Currently    Types: Cocaine, Marijuana    Comment: many years ago   Sexual activity: Not on file  Other Topics Concern   Not on file  Social History Narrative   Not on file   Social Determinants of Health   Financial Resource Strain: Not on file  Food Insecurity: No Food Insecurity (12/11/2021)   Hunger Vital Sign     Worried About Running Out of Food in the Last Year: Never true    Ran Out of Food in the Last Year: Never true  Transportation Needs: No Transportation Needs (10/30/2021)   PRAPARE - Administrator, Civil Service (Medical): No    Lack of Transportation (Non-Medical): No  Physical Activity: Not on file  Stress: Not on file  Social Connections: Not on file  Intimate Partner Violence: Not on file    FH:  Family History  Problem Relation Age of Onset   Coronary artery disease Mother    Atrial fibrillation Father    Breast cancer Maternal Aunt     Past Medical History:  Diagnosis Date   Asthma    CHF (congestive heart failure) (HCC)    GERD (gastroesophageal reflux disease)    Hypertension    Rocky Mountain spotted fever    TBI (traumatic brain injury) (HCC)     Current Outpatient Medications  Medication Sig Dispense Refill   aspirin EC 81 MG tablet Take 81 mg by mouth daily. Swallow whole.     clobetasol (TEMOVATE) 0.05 % external solution TO AFFECTED AREAS OF SCALP DAILY AS NEEDED UP TO 5D/WK. AVOID APPLYING TO FACE, GROIN, AND AXILLA. 50 mL 0   donepezil (ARICEPT) 5 MG tablet Take 5 mg by mouth  at bedtime.     empagliflozin (JARDIANCE) 10 MG TABS tablet Take 1 tablet (10 mg total) by mouth daily before breakfast. 30 tablet 5   escitalopram (LEXAPRO) 10 MG tablet Take 10 mg by mouth daily.     fluticasone (FLONASE) 50 MCG/ACT nasal spray Place 1 spray into both nostrils as needed for allergies or rhinitis.     furosemide (LASIX) 40 MG tablet Take 1 tablet (40 mg total) by mouth daily as needed. (Patient taking differently: Take 40 mg by mouth daily.) 30 tablet 10   gabapentin (NEURONTIN) 100 MG capsule Take 100 mg by mouth 2 (two) times daily.     levocetirizine (XYZAL) 5 MG tablet Take 5 mg by mouth daily.     lidocaine 4 % Place 1 patch onto the skin as needed (Back pain).     montelukast (SINGULAIR) 10 MG tablet Take 10 mg by mouth daily.     omeprazole  (PRILOSEC) 20 MG capsule Take 20 mg by mouth daily.     potassium chloride SA (KLOR-CON M) 20 MEQ tablet Take 1 tablet (20 mEq total) by mouth daily. 90 tablet 3   Tapinarof (VTAMA) 1 % CREA APPLY TO AFFECTED AREA OF PSORIASIS TOPICALLY ONCE EVERY DAY 60 g 0   VENTOLIN HFA 108 (90 Base) MCG/ACT inhaler Inhale 2 puffs into the lungs every 4 (four) hours as needed.     No current facility-administered medications for this visit.   There were no vitals filed for this visit.  Wt Readings from Last 3 Encounters:  07/02/22 160 lb (72.6 kg)  03/25/22 165 lb 8 oz (75.1 kg)  02/11/22 167 lb 3.2 oz (75.8 kg)   Lab Results  Component Value Date   CREATININE 0.66 07/02/2022   CREATININE 0.71 01/23/2022   CREATININE 0.91 08/14/2021   PHYSICAL EXAM:  General:  Well appearing. No resp difficulty HEENT: normal Neck: supple. JVP flat. No lymphadenopathy or thryomegaly appreciated. Cor: PMI normal. Regular rate & rhythm. No rubs, gallops or murmurs. Lungs: clear Abdomen: soft, nontender, nondistended. No hepatosplenomegaly. No bruits or masses.  Extremities: no cyanosis, clubbing, rash, trace pitting edema bilaterally Neuro: alert & oriented x3, cranial nerves grossly intact. Has bilateral knee braces on. Affect pleasant.   ECG: not done   ASSESSMENT & PLAN:  1: NICM with preserved ejection fraction- - likely due to HTN - NYHA class II - euvolemic  - weighing daily; reminded to call for an overnight weight gain of > 2 pounds or a weekly weight gain of > 5 pounds - weight down 5 pounds from last visit here 3 months ago - Echo 08/01/21 showed an EF of 60-65% - not adding salt and uses salt substitute only - saw cardiology (End) 11/23 - continue furosemide 40mg  daily - begin jardiance 10mg  daily; 30 day voucher provided - BMP next visit - if urination increases, may be able to decrease furosemide or use it PRN; will not decrease it today as she gets pedal edema as the day progresses -  encouraged leg elevation when sitting for long periods of time - BNP 07/27/21 was 15/3  2: HTN- - BP 111/79 - sees PCP @ [facility] - BMP 01/23/22 showed sodium 139, potassium 3.5, creatinine 0.71 and GFR >60 - BMP today  3: TBI- - sister is caregiver - saw neurology Marlene Bast) 02/24 - getting physical therapy  Return in 1 month, sooner if needed.

## 2022-08-07 ENCOUNTER — Ambulatory Visit: Payer: Medicaid Other | Admitting: Dermatology

## 2022-08-19 ENCOUNTER — Ambulatory Visit (INDEPENDENT_AMBULATORY_CARE_PROVIDER_SITE_OTHER): Payer: Medicaid Other | Admitting: Dermatology

## 2022-08-19 VITALS — BP 111/68 | HR 68

## 2022-08-19 DIAGNOSIS — Z79899 Other long term (current) drug therapy: Secondary | ICD-10-CM

## 2022-08-19 DIAGNOSIS — Z7189 Other specified counseling: Secondary | ICD-10-CM

## 2022-08-19 DIAGNOSIS — L819 Disorder of pigmentation, unspecified: Secondary | ICD-10-CM

## 2022-08-19 DIAGNOSIS — L409 Psoriasis, unspecified: Secondary | ICD-10-CM | POA: Diagnosis not present

## 2022-08-19 MED ORDER — VTAMA 1 % EX CREA: TOPICAL_CREAM | CUTANEOUS | 5 refills | Status: DC

## 2022-08-19 MED ORDER — CLOBETASOL PROPIONATE 0.05 % EX SOLN: CUTANEOUS | 5 refills | Status: DC

## 2022-08-19 NOTE — Patient Instructions (Signed)

## 2022-08-19 NOTE — Progress Notes (Unsigned)
   Follow-Up Visit   Subjective  April Chang is a 62 y.o. female who presents for the following: Psoriasis - currently using Vtama cream QD and Clobetasol solution to aa's scalp QD PRN. Patient doing well, but does have occasional flares.  This patient is accompanied in the office by her  sister  who is her care giver and contributes to hx.   The following portions of the chart were reviewed this encounter and updated as appropriate: medications, allergies, medical history  Review of Systems:  No other skin or systemic complaints except as noted in HPI or Assessment and Plan.  Objective  Well appearing patient in no apparent distress; mood and affect are within normal limits. Areas Examined: The face, scalp, and trunk Relevant exam findings are noted in the Assessment and Plan.   Assessment & Plan   Psoriasis  Related Medications Tapinarof (VTAMA) 1 % CREA APPLY TO AFFECTED AREA OF PSORIASIS TOPICALLY ONCE EVERY DAY  clobetasol (TEMOVATE) 0.05 % external solution TO AFFECTED AREAS OF SCALP DAILY AS NEEDED UP TO 5D/WK. AVOID APPLYING TO FACE, GROIN, AND AXILLA.    PSORIASIS with PIPA, improved compared to photos  Erythema and hyperpigmentation of the inframammary area.   5% BSA. Chronic condition with duration or expected duration over one year. Currently well-controlled. Treatment Plan: Continue Vtama cream to aa's QD PRN and  Clobetasol 0.05% solution to aa's scalp QD-BID 3 days per week PRN.   Counseling on psoriasis and coordination of care  psoriasis is a chronic non-curable, but treatable genetic/hereditary disease that may have other systemic features affecting other organ systems such as joints (Psoriatic Arthritis). It is associated with an increased risk of inflammatory bowel disease, heart disease, non-alcoholic fatty liver disease, and depression.  Treatments include light and laser treatments; topical medications; and systemic medications including oral and  injectables.  Long term medication management.  Patient is using long term (months to years) prescription medication  to control their dermatologic condition.  These medications require periodic monitoring to evaluate for efficacy and side effects and may require periodic laboratory monitoring.  Return in about 6 months (around 02/19/2023) for psoriasis follow up.  Maylene Roes, CMA, am acting as scribe for Armida Sans, MD .  Documentation: I have reviewed the above documentation for accuracy and completeness, and I agree with the above.  Armida Sans, MD

## 2022-08-20 ENCOUNTER — Encounter: Payer: Self-pay | Admitting: Dermatology

## 2022-09-02 NOTE — Progress Notes (Unsigned)
PCP: [facility] Primary Cardiologist: Okey Dupre, Cristal Deer, MD (last seen 11/23)  HPI:  April Chang is a 62 y/o female with a history of asthma, HTN, GERD, TBI, previous tobacco use and chronic heart failure.   Was in the ED 01/23/22 due to cough, edema and reported weight gain. Diuretic provided.   Echo 08/01/21 showed an EF of 60-65%  She presents today for a HF follow-up visit with a chief complaint of minimal shortness of breath with moderate exertion. Chronic in nature. Has associated fatigue, wheezing, pedal edema, snoring & apneic episodes. Denies chest pain, cough, palpitations, abdominal distention or dizziness. Says that sometimes she sleeps "all the time" and other times she has difficulty sleeping.   At last visit, jardiance 10mg  was started with subsequent diuretic change to PRN.   Patient has TBI so sister is guardian and patient also has a caregiver.   ROS: All systems negative except as listed in HPI, PMH and Problem List.  SH:  Social History   Socioeconomic History   Marital status: Single    Spouse name: Not on file   Number of children: Not on file   Years of education: Not on file   Highest education level: Not on file  Occupational History   Not on file  Tobacco Use   Smoking status: Former    Current packs/day: 0.00    Types: Cigarettes    Quit date: 84    Years since quitting: 34.6   Smokeless tobacco: Not on file  Vaping Use   Vaping status: Never Used  Substance and Sexual Activity   Alcohol use: Not Currently    Comment: Former alcoholic   Drug use: Not Currently    Types: Cocaine, Marijuana    Comment: many years ago   Sexual activity: Not on file  Other Topics Concern   Not on file  Social History Narrative   Not on file   Social Determinants of Health   Financial Resource Strain: Not on file  Food Insecurity: No Food Insecurity (12/11/2021)   Hunger Vital Sign    Worried About Running Out of Food in the Last Year:  Never true    Ran Out of Food in the Last Year: Never true  Transportation Needs: No Transportation Needs (10/30/2021)   PRAPARE - Administrator, Civil Service (Medical): No    Lack of Transportation (Non-Medical): No  Physical Activity: Not on file  Stress: Not on file  Social Connections: Not on file  Intimate Partner Violence: Not on file    FH:  Family History  Problem Relation Age of Onset   Coronary artery disease Mother    Atrial fibrillation Father    Breast cancer Maternal Aunt     Past Medical History:  Diagnosis Date   Asthma    CHF (congestive heart failure) (HCC)    GERD (gastroesophageal reflux disease)    Hypertension    Rocky Mountain spotted fever    TBI (traumatic brain injury) (HCC)     Current Outpatient Medications  Medication Sig Dispense Refill   aspirin EC 81 MG tablet Take 81 mg by mouth daily. Swallow whole.     clobetasol (TEMOVATE) 0.05 % external solution TO AFFECTED AREAS OF SCALP DAILY AS NEEDED UP TO 5D/WK. AVOID APPLYING TO FACE, GROIN, AND AXILLA. 50 mL 5   donepezil (ARICEPT) 5 MG tablet Take 5 mg by mouth at bedtime.     empagliflozin (JARDIANCE) 10 MG TABS tablet Take 1 tablet (  10 mg total) by mouth daily before breakfast. 30 tablet 5   escitalopram (LEXAPRO) 10 MG tablet Take 10 mg by mouth daily.     fluticasone (FLONASE) 50 MCG/ACT nasal spray Place 1 spray into both nostrils as needed for allergies or rhinitis.     furosemide (LASIX) 40 MG tablet Take 1 tablet (40 mg total) by mouth daily as needed. (Patient taking differently: Take 40 mg by mouth daily.) 30 tablet 10   gabapentin (NEURONTIN) 100 MG capsule Take 100 mg by mouth 2 (two) times daily.     levocetirizine (XYZAL) 5 MG tablet Take 5 mg by mouth daily.     lidocaine 4 % Place 1 patch onto the skin as needed (Back pain).     montelukast (SINGULAIR) 10 MG tablet Take 10 mg by mouth daily.     omeprazole (PRILOSEC) 20 MG capsule Take 20 mg by mouth daily.      potassium chloride SA (KLOR-CON M) 20 MEQ tablet Take 1 tablet (20 mEq total) by mouth daily. 90 tablet 3   Tapinarof (VTAMA) 1 % CREA APPLY TO AFFECTED AREA OF PSORIASIS TOPICALLY ONCE EVERY DAY 60 g 5   VENTOLIN HFA 108 (90 Base) MCG/ACT inhaler Inhale 2 puffs into the lungs every 4 (four) hours as needed.     No current facility-administered medications for this visit.   Vitals:   09/03/22 1032  BP: 106/72  Pulse: 88  SpO2: 97%  Weight: 160 lb (72.6 kg)   Wt Readings from Last 3 Encounters:  09/03/22 160 lb (72.6 kg)  07/02/22 160 lb (72.6 kg)  03/25/22 165 lb 8 oz (75.1 kg)   Lab Results  Component Value Date   CREATININE 0.66 07/02/2022   CREATININE 0.71 01/23/2022   CREATININE 0.91 08/14/2021   PHYSICAL EXAM:  General:  Well appearing. No resp difficulty HEENT: normal Neck: supple. JVP flat. No lymphadenopathy or thryomegaly appreciated. Cor: PMI normal. Regular rate & rhythm. No rubs, gallops or murmurs. Lungs: clear Abdomen: soft, nontender, nondistended. No hepatosplenomegaly. No bruits or masses.  Extremities: no cyanosis, clubbing, rash,1+ pitting edema left lower leg; trace pitting right lower leg Neuro: alert & oriented x3, cranial nerves grossly intact. Has bilateral knee braces on. Affect pleasant.   ECG: not done   ASSESSMENT & PLAN:  1: NICM with preserved ejection fraction- - likely due to HTN - NYHA class II - euvolemic  - weighing daily; reminded to call for an overnight weight gain of > 2 pounds or a weekly weight gain of > 5 pounds - weight stable from last visit here 2 months ago - Echo 08/01/21 showed an EF of 60-65% - plan to schedule updated echo at next visit - not adding salt and uses salt substitute only - saw cardiology (End) 11/23 - continue furosemide 40mg  daily PRN - continue jardiance 10mg  daily - continue potassium daily - BMP/ Mg today - instructed to get compression socks and put them on every morning with removal at  bedtime - elevate legs when sitting for long periods of time - BNP 07/27/21 was 15/3  2: HTN- - BP 106/72 manually - sees PCP @ [facility] - BMP 07/02/22 showed sodium 139, potassium 3.3, creatinine 0.66 and GFR >60 - BMP today  3: TBI- - sister is caregiver - saw neurology Marlene Bast) 02/24 - getting physical therapy  4: Snoring- - patient endorses waking up fatigued at times - sister confirms that patient snores and has woken herself sometimes gasping -  will do sleep study to evaluate for sleep apnea; will see if insurance approves home sleep study   Return in 3 months, sooner if needed.

## 2022-09-03 ENCOUNTER — Ambulatory Visit: Payer: Medicaid Other | Attending: Family | Admitting: Family

## 2022-09-03 ENCOUNTER — Encounter: Payer: Self-pay | Admitting: Family

## 2022-09-03 VITALS — BP 106/72 | HR 88 | Wt 160.0 lb

## 2022-09-03 DIAGNOSIS — X58XXXS Exposure to other specified factors, sequela: Secondary | ICD-10-CM | POA: Diagnosis not present

## 2022-09-03 DIAGNOSIS — Z87820 Personal history of traumatic brain injury: Secondary | ICD-10-CM

## 2022-09-03 DIAGNOSIS — I5032 Chronic diastolic (congestive) heart failure: Secondary | ICD-10-CM | POA: Diagnosis not present

## 2022-09-03 DIAGNOSIS — I10 Essential (primary) hypertension: Secondary | ICD-10-CM

## 2022-09-03 DIAGNOSIS — I428 Other cardiomyopathies: Secondary | ICD-10-CM | POA: Diagnosis not present

## 2022-09-03 DIAGNOSIS — S069XAS Unspecified intracranial injury with loss of consciousness status unknown, sequela: Secondary | ICD-10-CM | POA: Insufficient documentation

## 2022-09-03 DIAGNOSIS — R0683 Snoring: Secondary | ICD-10-CM | POA: Insufficient documentation

## 2022-09-03 DIAGNOSIS — I110 Hypertensive heart disease with heart failure: Secondary | ICD-10-CM | POA: Insufficient documentation

## 2022-09-03 DIAGNOSIS — Z87891 Personal history of nicotine dependence: Secondary | ICD-10-CM | POA: Diagnosis not present

## 2022-09-03 NOTE — Progress Notes (Signed)
Height: 5'1"    Weight:160 lb BMI:30.23  Today's Date:09/03/2022  STOP BANG RISK ASSESSMENT S (snore) Have you been told that you snore?     YES  T (tired) Are you often tired, fatigued, or sleepy during the day?   YES  O (obstruction) Do you stop breathing, choke, or gasp during sleep? NO   P (pressure) Do you have or are you being treated for high blood pressure? NO   B (BMI) Is your body index greater than 35 kg/m? NO   A (age) Are you 61 years old or older? YES   N (neck) Do you have a neck circumference greater than 16 inches?   NO   G (gender) Are you a female? NO   TOTAL STOP/BANG "YES" ANSWERS 3                                                                       For Office Use Only              Procedure Order Form    YES to 3+ Stop Bang questions OR two clinical symptoms - patient qualifies for [employer] (CPT 95800)      Clinical Notes: Will consult Sleep Specialist and refer for management of therapy due to patient increased risk of Sleep Apnea. Ordering a sleep study due to the following two clinical symptoms: Excessive daytime sleepiness G47.10 / Gastroesophageal reflux K21.9 / Difficulty concentrating R41.840 / Memory problems or poor judgment G31.84 /  Loud snoring R06.83 / Unrefreshed by sleep G47.8  / Insomnia G47.00

## 2022-09-03 NOTE — Patient Instructions (Signed)
Please go down to [facility] to have your blood work completed inside of the [facility] office.  Please do NOT open your at home sleep study until we call you with insurance approval.  Your code for the sleep study will be [id number].

## 2022-09-04 ENCOUNTER — Encounter: Payer: Self-pay | Admitting: Family

## 2022-09-06 NOTE — Addendum Note (Signed)
Addended by: Electa Sniff on: 09/06/2022 11:56 AM   Modules accepted: Orders

## 2022-09-12 ENCOUNTER — Encounter: Payer: Self-pay | Admitting: Family

## 2022-10-01 ENCOUNTER — Telehealth: Payer: Self-pay

## 2022-10-01 DIAGNOSIS — I5032 Chronic diastolic (congestive) heart failure: Secondary | ICD-10-CM

## 2022-10-04 NOTE — Telephone Encounter (Signed)
Called and spoke with pt's gaurdian per Pamalee Leyden, RN. Pt needs to bring back In home sleep study. A new referral for facility sleep study is being placed as requested. Pt's guardian agreeable. Plans to bring it back [facility] 10/07/22.

## 2022-10-04 NOTE — Addendum Note (Signed)
Addended by: Electa Sniff on: 10/04/2022 02:28 PM   Modules accepted: Orders

## 2022-10-22 ENCOUNTER — Telehealth: Payer: Self-pay

## 2022-10-22 NOTE — Telephone Encounter (Signed)
Spoke with patient's guardian, Kendal Hymen, to inquire if they had heard from sleep study clinic for setting up in-lab sleep study. She states they have not heard anything. Phone number for [facility] given to [place].  Reminded pt's guardian to return home sleep device, unopened, to HF clinic at her earliest convenience, so they will not be charged for device. Pt's guardian aware, agreeable, and verbalized understanding

## 2022-10-24 ENCOUNTER — Encounter: Payer: Self-pay | Admitting: Family

## 2022-11-07 ENCOUNTER — Telehealth: Payer: Self-pay

## 2022-11-07 MED ORDER — ZORYVE 0.3 % EX CREA: 1.0000 | TOPICAL_CREAM | Freq: Every day | CUTANEOUS | 6 refills | Status: DC

## 2022-11-07 NOTE — Telephone Encounter (Signed)
Patient recently changed insurance to [facility] and they will not cover Vtama cream, can she change to something medicaid will cover

## 2022-11-07 NOTE — Telephone Encounter (Signed)
April Chang, patients sister that we would send in Zoryve 0.3% cr qd to aa psoriasis.  Sent to [facility]./sh

## 2022-11-29 NOTE — Progress Notes (Unsigned)
PCP: [facility] Primary Cardiologist: Okey Dupre, Cristal Deer, MD (last seen 11/23)  HPI:  April Chang is a 62 y/o female with a history of asthma, HTN, GERD, TBI, previous tobacco use and chronic heart failure.   Was in the ED 01/23/22 due to cough, edema and reported weight gain. Diuretic provided.   Echo 08/01/21 showed an EF of 60-65%  She presents today for a HF follow-up visit with a chief complaint of minimal shortness of breath with moderate exertion. Chronic in nature. Has associated fatigue and wheezing along with this. Sleeping better at night as daytime naps have diminished. Does endorse a decreased appetite and the feeling like she has had a fever. Sister that is present and is her caregiver denies a fever or that patient has felt warm. Denies chest pain, cough, palpitations, abdominal distention, pedal edema or weight gain.   Taking her furosemide/ jardiance daily.    Patient has TBI so sister is guardian and patient also has a caregiver.   ROS: All systems negative except as listed in HPI, PMH and Problem List.  SH:  Social History   Socioeconomic History   Marital status: Single    Spouse name: Not on file   Number of children: Not on file   Years of education: Not on file   Highest education level: Not on file  Occupational History   Not on file  Tobacco Use   Smoking status: Former    Current packs/day: 0.00    Types: Cigarettes    Quit date: 22    Years since quitting: 34.8   Smokeless tobacco: Not on file  Vaping Use   Vaping status: Never Used  Substance and Sexual Activity   Alcohol use: Not Currently    Comment: Former alcoholic   Drug use: Not Currently    Types: Cocaine, Marijuana    Comment: many years ago   Sexual activity: Not on file  Other Topics Concern   Not on file  Social History Narrative   Not on file   Social Determinants of Health   Financial Resource Strain: Not on file  Food Insecurity: No Food Insecurity  (12/11/2021)   Hunger Vital Sign    Worried About Running Out of Food in the Last Year: Never true    Ran Out of Food in the Last Year: Never true  Transportation Needs: No Transportation Needs (10/30/2021)   PRAPARE - Administrator, Civil Service (Medical): No    Lack of Transportation (Non-Medical): No  Physical Activity: Not on file  Stress: Not on file  Social Connections: Not on file  Intimate Partner Violence: Not on file    FH:  Family History  Problem Relation Age of Onset   Coronary artery disease Mother    Atrial fibrillation Father    Breast cancer Maternal Aunt     Past Medical History:  Diagnosis Date   Asthma    CHF (congestive heart failure) (HCC)    GERD (gastroesophageal reflux disease)    Hypertension    Rocky Mountain spotted fever    TBI (traumatic brain injury) (HCC)     Current Outpatient Medications  Medication Sig Dispense Refill   aspirin EC 81 MG tablet Take 81 mg by mouth daily. Swallow whole.     clobetasol (TEMOVATE) 0.05 % external solution TO AFFECTED AREAS OF SCALP DAILY AS NEEDED UP TO 5D/WK. AVOID APPLYING TO FACE, GROIN, AND AXILLA. 50 mL 5   donepezil (ARICEPT) 5 MG tablet Take  5 mg by mouth at bedtime.     empagliflozin (JARDIANCE) 10 MG TABS tablet Take 1 tablet (10 mg total) by mouth daily before breakfast. 30 tablet 5   escitalopram (LEXAPRO) 10 MG tablet Take 10 mg by mouth daily.     fluticasone (FLONASE) 50 MCG/ACT nasal spray Place 1 spray into both nostrils as needed for allergies or rhinitis.     furosemide (LASIX) 40 MG tablet Take 1 tablet (40 mg total) by mouth daily as needed. (Patient taking differently: Take 40 mg by mouth as needed.) 30 tablet 10   gabapentin (NEURONTIN) 100 MG capsule Take 100 mg by mouth 2 (two) times daily.     levocetirizine (XYZAL) 5 MG tablet Take 5 mg by mouth daily.     lidocaine 4 % Place 1 patch onto the skin as needed (Back pain). (Patient not taking: Reported on 09/03/2022)      montelukast (SINGULAIR) 10 MG tablet Take 10 mg by mouth daily.     omeprazole (PRILOSEC) 20 MG capsule Take 20 mg by mouth daily.     potassium chloride SA (KLOR-CON M) 20 MEQ tablet Take 1 tablet (20 mEq total) by mouth daily. (Patient taking differently: Take 10 mEq by mouth daily.) 90 tablet 3   Roflumilast (ZORYVE) 0.3 % CREA Apply 1 Application topically daily. qd to aa psoriasis prn flares 60 g 6   VENTOLIN HFA 108 (90 Base) MCG/ACT inhaler Inhale 2 puffs into the lungs every 4 (four) hours as needed.     No current facility-administered medications for this visit.   Vitals:   12/02/22 1057  BP: (!) 116/58  Pulse: 85  SpO2: 95%  Weight: 156 lb (70.8 kg)   Wt Readings from Last 3 Encounters:  12/02/22 156 lb (70.8 kg)  09/03/22 160 lb (72.6 kg)  07/02/22 160 lb (72.6 kg)   Lab Results  Component Value Date   CREATININE 0.88 09/03/2022   CREATININE 0.66 07/02/2022   CREATININE 0.71 01/23/2022   PHYSICAL EXAM:  General:  Well appearing. No resp difficulty HEENT: normal Neck: supple. JVP flat. No lymphadenopathy or thryomegaly appreciated. Cor: PMI normal. Regular rate & rhythm. No rubs, gallops or murmurs. Lungs: clear Abdomen: soft, nontender, nondistended. No hepatosplenomegaly. No bruits or masses.  Extremities: no cyanosis, clubbing, rash, edema Neuro: alert & oriented x3, cranial nerves grossly intact. Has right knee brace on. Affect pleasant.   ECG: not done   ASSESSMENT & PLAN:  1: NICM with preserved ejection fraction- - likely due to HTN - NYHA class II - euvolemic  - weighing daily; reminded to call for an overnight weight gain of > 2 pounds or a weekly weight gain of > 5 pounds - weight down 4 pounds from last visit here 3 months ago - Echo 08/01/21 showed an EF of 60-65% - will order repeat echo - not adding salt and uses salt substitute only - saw cardiology (End) 11/23 - continue furosemide 40mg  daily  - continue jardiance 10mg  daily - continue  potassium daily - discussed possible adding spironolactone at next visit after echo has been done - elevate legs when sitting for long periods of time - BNP 07/27/21 was 15/3  2: HTN- - BP 116/58 - sees PCP @ [facility] - BMP 09/03/22 showed sodium 142, potassium 3.9, creatinine 0.88 and GFR 75  3: TBI- - sister is caregiver - saw neurology Marlene Bast) 08/24 - getting physical therapy  4: Snoring- - patient endorses waking up fatigued at  times - sister confirms that patient snores and has woken herself sometimes gasping - sister says that she returned the inhome sleep study ~ 1 week ago  Return in 1 month after echo, sooner if needed.

## 2022-12-02 ENCOUNTER — Ambulatory Visit: Payer: Medicaid Other | Attending: Family | Admitting: Family

## 2022-12-02 ENCOUNTER — Encounter: Payer: Self-pay | Admitting: Family

## 2022-12-02 VITALS — BP 116/58 | HR 85 | Wt 156.0 lb

## 2022-12-02 DIAGNOSIS — R0602 Shortness of breath: Secondary | ICD-10-CM | POA: Insufficient documentation

## 2022-12-02 DIAGNOSIS — I428 Other cardiomyopathies: Secondary | ICD-10-CM | POA: Diagnosis not present

## 2022-12-02 DIAGNOSIS — K219 Gastro-esophageal reflux disease without esophagitis: Secondary | ICD-10-CM | POA: Insufficient documentation

## 2022-12-02 DIAGNOSIS — Z87891 Personal history of nicotine dependence: Secondary | ICD-10-CM | POA: Insufficient documentation

## 2022-12-02 DIAGNOSIS — Z87820 Personal history of traumatic brain injury: Secondary | ICD-10-CM | POA: Insufficient documentation

## 2022-12-02 DIAGNOSIS — J45909 Unspecified asthma, uncomplicated: Secondary | ICD-10-CM | POA: Diagnosis not present

## 2022-12-02 DIAGNOSIS — Z7984 Long term (current) use of oral hypoglycemic drugs: Secondary | ICD-10-CM | POA: Insufficient documentation

## 2022-12-02 DIAGNOSIS — R0683 Snoring: Secondary | ICD-10-CM | POA: Insufficient documentation

## 2022-12-02 DIAGNOSIS — I10 Essential (primary) hypertension: Secondary | ICD-10-CM

## 2022-12-02 DIAGNOSIS — I110 Hypertensive heart disease with heart failure: Secondary | ICD-10-CM | POA: Insufficient documentation

## 2022-12-02 DIAGNOSIS — I5032 Chronic diastolic (congestive) heart failure: Secondary | ICD-10-CM | POA: Insufficient documentation

## 2022-12-02 DIAGNOSIS — Z79899 Other long term (current) drug therapy: Secondary | ICD-10-CM | POA: Diagnosis not present

## 2022-12-02 NOTE — Patient Instructions (Signed)
Go over to the [facility] and have your echo completed. You do have to check in 15 mins before your appt for preparation.

## 2022-12-03 ENCOUNTER — Telehealth (INDEPENDENT_AMBULATORY_CARE_PROVIDER_SITE_OTHER): Payer: Self-pay | Admitting: Pulmonary Disease

## 2022-12-03 DIAGNOSIS — R0683 Snoring: Secondary | ICD-10-CM | POA: Diagnosis not present

## 2022-12-03 NOTE — Telephone Encounter (Signed)
Home sleep test 

## 2022-12-10 ENCOUNTER — Other Ambulatory Visit: Payer: Self-pay | Admitting: Family

## 2022-12-10 ENCOUNTER — Other Ambulatory Visit: Payer: Self-pay | Admitting: Internal Medicine

## 2022-12-10 DIAGNOSIS — M7989 Other specified soft tissue disorders: Secondary | ICD-10-CM

## 2022-12-10 NOTE — Telephone Encounter (Signed)
Please advise on refill request  last visit 12/05/21 with plan to f/u PRN  next visit: none  Patient is followed by [name] in [facility]

## 2022-12-14 ENCOUNTER — Encounter: Payer: Self-pay | Admitting: Internal Medicine

## 2022-12-14 DIAGNOSIS — M7989 Other specified soft tissue disorders: Secondary | ICD-10-CM

## 2022-12-16 MED ORDER — FUROSEMIDE 40 MG PO TABS: 40.0000 mg | ORAL_TABLET | Freq: Every day | ORAL | 0 refills | Status: DC | PRN

## 2022-12-28 ENCOUNTER — Other Ambulatory Visit: Payer: Self-pay | Admitting: Family

## 2022-12-28 DIAGNOSIS — M7989 Other specified soft tissue disorders: Secondary | ICD-10-CM

## 2023-01-03 ENCOUNTER — Ambulatory Visit
Admission: RE | Admit: 2023-01-03 | Discharge: 2023-01-03 | Disposition: A | Discharge status: Home / Self Care | Payer: Medicaid Other | Source: Ambulatory Visit | Attending: Family | Admitting: Family

## 2023-01-03 ENCOUNTER — Encounter: Payer: Self-pay | Admitting: Family

## 2023-01-03 DIAGNOSIS — I5032 Chronic diastolic (congestive) heart failure: Secondary | ICD-10-CM | POA: Insufficient documentation

## 2023-01-03 DIAGNOSIS — I509 Heart failure, unspecified: Secondary | ICD-10-CM | POA: Diagnosis present

## 2023-01-03 LAB — ECHOCARDIOGRAM COMPLETE
AR max vel: 2.52 cm2
AV Area VTI: 2.62 cm2
AV Area mean vel: 2.54 cm2
AV Mean grad: 1.7 mm[Hg]
AV Peak grad: 2.9 mm[Hg]
Ao pk vel: 0.85 m/s
Area-P 1/2: 4.93 cm2
MV VTI: 2.09 cm2
S' Lateral: 2.3 cm

## 2023-01-03 NOTE — Progress Notes (Signed)
*  PRELIMINARY RESULTS* Echocardiogram 2D Echocardiogram has been performed.  Cristela Blue 01/03/2023, 11:48 AM

## 2023-01-09 NOTE — Progress Notes (Signed)
PCP: [facility] Primary Cardiologist: Okey Dupre, Cristal Deer, MD (last seen 11/23)  Chief Complaint: shortness of breath  HPI:  April Chang is a 62 y/o female with a history of asthma, HTN, GERD, TBI, previous tobacco use and chronic heart failure.   Was in the ED 01/23/22 due to cough, edema and reported weight gain. Diuretic provided.   Echo 08/01/21: EF 60-65% Echo 01/03/23: EF 60-65% with Grade I DD  She presents today for a HF follow-up visit with a chief complaint of minimal shortness of breath with moderate exertion. Chronic in nature although her sister says that she notices an improvement in her breathing ever since starting jardiance. Has associated fatigue, headaches, occasional wheezing and left knee pain along with this. Denies chest pain, palpitations, abdominal distention, dizziness, pedal edema or difficulty sleeping.   Patient has TBI so sister is guardian and patient also has a caregiver.   No longer is doing home physical therapy because her insurance denied it.   ROS: All systems negative except as listed in HPI, PMH and Problem List.  SH:  Social History   Socioeconomic History   Marital status: Single    Spouse name: Not on file   Number of children: Not on file   Years of education: Not on file   Highest education level: Not on file  Occupational History   Not on file  Tobacco Use   Smoking status: Former    Current packs/day: 0.00    Types: Cigarettes    Quit date: 42    Years since quitting: 34.9   Smokeless tobacco: Not on file  Vaping Use   Vaping status: Never Used  Substance and Sexual Activity   Alcohol use: Not Currently    Comment: Former alcoholic   Drug use: Not Currently    Types: Cocaine, Marijuana    Comment: many years ago   Sexual activity: Not on file  Other Topics Concern   Not on file  Social History Narrative   Not on file   Social Drivers of Health   Financial Resource Strain: Not on file  Food Insecurity:  No Food Insecurity (12/11/2021)   Hunger Vital Sign    Worried About Running Out of Food in the Last Year: Never true    Ran Out of Food in the Last Year: Never true  Transportation Needs: No Transportation Needs (10/30/2021)   PRAPARE - Administrator, Civil Service (Medical): No    Lack of Transportation (Non-Medical): No  Physical Activity: Not on file  Stress: Not on file  Social Connections: Not on file  Intimate Partner Violence: Not on file    FH:  Family History  Problem Relation Age of Onset   Coronary artery disease Mother    Atrial fibrillation Father    Breast cancer Maternal Aunt     Past Medical History:  Diagnosis Date   Asthma    CHF (congestive heart failure) (HCC)    GERD (gastroesophageal reflux disease)    Hypertension    Rocky Mountain spotted fever    TBI (traumatic brain injury) (HCC)     Current Outpatient Medications  Medication Sig Dispense Refill   aspirin EC 81 MG tablet Take 81 mg by mouth daily. Swallow whole.     clobetasol (TEMOVATE) 0.05 % external solution TO AFFECTED AREAS OF SCALP DAILY AS NEEDED UP TO 5D/WK. AVOID APPLYING TO FACE, GROIN, AND AXILLA. 50 mL 5   donepezil (ARICEPT) 5 MG tablet Take 5 mg  by mouth 2 (two) times daily.     fluticasone (FLONASE) 50 MCG/ACT nasal spray Place 1 spray into both nostrils as needed for allergies or rhinitis.     furosemide (LASIX) 40 MG tablet TAKE 1 TABLET BY MOUTH DAILY AS NEEDED 90 tablet 1   gabapentin (NEURONTIN) 100 MG capsule Take 100 mg by mouth 2 (two) times daily.     JARDIANCE 10 MG TABS tablet TAKE 1 TABLET BY MOUTH DAILY BEFORE BREAKFAST. 30 tablet 5   levocetirizine (XYZAL) 5 MG tablet Take 5 mg by mouth daily.     lidocaine 4 % Place 1 patch onto the skin as needed (Back pain).     montelukast (SINGULAIR) 10 MG tablet Take 10 mg by mouth daily.     omeprazole (PRILOSEC) 20 MG capsule Take 20 mg by mouth daily.     potassium chloride SA (KLOR-CON M) 20 MEQ tablet Take 1  tablet (20 mEq total) by mouth daily. (Patient taking differently: Take 10 mEq by mouth daily.) 90 tablet 3   Roflumilast (ZORYVE) 0.3 % CREA Apply 1 Application topically daily. qd to aa psoriasis prn flares 60 g 6   VENTOLIN HFA 108 (90 Base) MCG/ACT inhaler Inhale 2 puffs into the lungs every 4 (four) hours as needed.     No current facility-administered medications for this visit.   Vitals:   01/10/23 1100  BP: 121/73  Pulse: 81  Weight: 157 lb 12.8 oz (71.6 kg)  Height: 5\' 2"  (1.575 m)   Wt Readings from Last 3 Encounters:  01/10/23 157 lb 12.8 oz (71.6 kg)  12/02/22 156 lb (70.8 kg)  09/03/22 160 lb (72.6 kg)   Lab Results  Component Value Date   CREATININE 0.88 09/03/2022   CREATININE 0.66 07/02/2022   CREATININE 0.71 01/23/2022    PHYSICAL EXAM:  General:  Well appearing. No resp difficulty HEENT: normal Neck: supple. JVP flat. No lymphadenopathy or thryomegaly appreciated. Cor: PMI normal. Regular rate & rhythm. No rubs, gallops or murmurs. Lungs: clear Abdomen: soft, nontender, nondistended. No hepatosplenomegaly. No bruits or masses.  Extremities: no cyanosis, clubbing, rash, edema Neuro: alert & orientedx3, cranial nerves grossly intact. Moves all 4 extremities w/o difficulty. Affect pleasant.   ECG: not done   ASSESSMENT & PLAN:  1: NICM with preserved ejection fraction- - likely due to HTN - NYHA class II - euvolemic  - weighing daily; reminded to call for an overnight weight gain of > 2 pounds or a weekly weight gain of > 5 pounds - weight stable from last visit here 1 month ago - Echo 08/01/21 showed an EF of 60-65% - Echo 01/03/23: EF 60-65% with Grade I DD - not adding salt and uses salt substitute only - saw cardiology (End) 11/23 - continue furosemide 40mg  daily  - continue jardiance 10mg  daily - continue potassium daily - could consider adding spironolactone if BP maintains - BNP 07/27/21 was 15/3  2: HTN- - BP 121/73 - sees PCP @  [facility] - BMP 09/03/22 showed sodium 142, potassium 3.9, creatinine 0.88 and GFR 75 - BMET next week as she says that she can't stay to do it today  3: TBI- - sister is caregiver - saw neurology Marlene Bast) 08/24 - no longer getting physical therapy due to insurance issues  4: Snoring- - patient endorses waking up fatigued at times although less so - sister confirms that patient snores and has woken herself sometimes gasping - sister says that she returned the  inhome sleep study but she questions the accuracy as when she woke up, parts had become disconnected - if we need to repeat it, may need to be at the sleep lab   Return in 4 months, sooner if needed.

## 2023-01-10 ENCOUNTER — Encounter: Payer: Self-pay | Admitting: Family

## 2023-01-10 ENCOUNTER — Ambulatory Visit: Payer: Medicaid Other | Attending: Family | Admitting: Family

## 2023-01-10 VITALS — BP 121/73 | HR 81 | Ht 62.0 in | Wt 157.8 lb

## 2023-01-10 DIAGNOSIS — I10 Essential (primary) hypertension: Secondary | ICD-10-CM | POA: Diagnosis not present

## 2023-01-10 DIAGNOSIS — Z87891 Personal history of nicotine dependence: Secondary | ICD-10-CM | POA: Diagnosis not present

## 2023-01-10 DIAGNOSIS — K219 Gastro-esophageal reflux disease without esophagitis: Secondary | ICD-10-CM | POA: Insufficient documentation

## 2023-01-10 DIAGNOSIS — R0602 Shortness of breath: Secondary | ICD-10-CM | POA: Diagnosis present

## 2023-01-10 DIAGNOSIS — I428 Other cardiomyopathies: Secondary | ICD-10-CM | POA: Diagnosis not present

## 2023-01-10 DIAGNOSIS — I5032 Chronic diastolic (congestive) heart failure: Secondary | ICD-10-CM | POA: Diagnosis not present

## 2023-01-10 DIAGNOSIS — X58XXXS Exposure to other specified factors, sequela: Secondary | ICD-10-CM | POA: Diagnosis not present

## 2023-01-10 DIAGNOSIS — R0683 Snoring: Secondary | ICD-10-CM | POA: Diagnosis not present

## 2023-01-10 DIAGNOSIS — Z87820 Personal history of traumatic brain injury: Secondary | ICD-10-CM

## 2023-01-10 DIAGNOSIS — J45909 Unspecified asthma, uncomplicated: Secondary | ICD-10-CM | POA: Diagnosis not present

## 2023-01-10 DIAGNOSIS — I110 Hypertensive heart disease with heart failure: Secondary | ICD-10-CM | POA: Diagnosis not present

## 2023-01-10 DIAGNOSIS — S069XAS Unspecified intracranial injury with loss of consciousness status unknown, sequela: Secondary | ICD-10-CM | POA: Diagnosis not present

## 2023-01-10 NOTE — Patient Instructions (Signed)
Go over to the [facility]ICAL [facility]. Go pass the gift shop and have your blood work completed in 1 week.  We will only call you if the results are abnormal or if the provider would like to make medication changes.

## 2023-02-25 ENCOUNTER — Ambulatory Visit: Payer: Medicaid Other | Admitting: Dermatology

## 2023-03-04 ENCOUNTER — Telehealth: Payer: Self-pay

## 2023-03-04 NOTE — Telephone Encounter (Signed)
Patient care giver Kendal Hymen calling, Zoryve cream is not helping rash beneath her breast, past topical Vtama helped, pt would like to restart Vtama,    LM on VM please call here, she will have to fail Calcipotriene cream before medicaid will pay for [facility]

## 2023-03-27 ENCOUNTER — Ambulatory Visit: Payer: Medicaid Other | Admitting: Dermatology

## 2023-05-05 ENCOUNTER — Ambulatory Visit (INDEPENDENT_AMBULATORY_CARE_PROVIDER_SITE_OTHER): Admitting: Dermatology

## 2023-05-05 DIAGNOSIS — L710 Perioral dermatitis: Secondary | ICD-10-CM | POA: Diagnosis not present

## 2023-05-05 DIAGNOSIS — L409 Psoriasis, unspecified: Secondary | ICD-10-CM

## 2023-05-05 DIAGNOSIS — Z7189 Other specified counseling: Secondary | ICD-10-CM

## 2023-05-05 MED ORDER — ZORYVE 0.3 % EX CREA: 1.0000 | TOPICAL_CREAM | Freq: Every day | CUTANEOUS | 6 refills | Status: AC

## 2023-05-05 MED ORDER — CLOBETASOL PROPIONATE 0.05 % EX SOLN: CUTANEOUS | 5 refills | Status: AC

## 2023-05-05 NOTE — Progress Notes (Signed)
   Follow-Up Visit   Subjective  April Chang is a 63 y.o. female who presents for the following: Psoriasis  Using clobetasol  at scalp about once weekly, Zoryve  at inframammary and groin. Patient used samples of Vtama  which helped but patient unable to get it.   Patient accompanied by sister April Chang who contributes to history.   The following portions of the chart were reviewed this encounter and updated as appropriate: medications, allergies, medical history  Review of Systems:  No other skin or systemic complaints except as noted in HPI or Assessment and Plan.  Objective  Well appearing patient in no apparent distress; mood and affect are within normal limits.  Areas Examined: Scalp, face, inframammary  Relevant exam findings are noted in the Assessment and Plan.    perioral Perioral inflamed scaly papules  Assessment & Plan   PSORIASIS   Related Medications clobetasol  (TEMOVATE ) 0.05 % external solution TO AFFECTED AREAS OF SCALP DAILY AS NEEDED UP TO 5D/WK. AVOID APPLYING TO FACE, GROIN, AND AXILLA. Roflumilast  (ZORYVE ) 0.3 % CREA Apply 1 Application topically daily. qd to aa psoriasis prn flares PERIORAL DERMATITIS perioral Chronic flaring potential for recurrence not at goal  Typically treat with doxy but it has a minor interaction with furosemide . Jointly decided to try topical treatment first Patient will see if she has improvement with Zoryve , if not improving, consider adding doxycycline. Start Zoryve  to aa's face every day until smooth. Medication was inexpensive per patient report Avoid topical steroids on face, can trigger or worsen COUNSELING AND COORDINATION OF CARE    PSORIASIS Exam: scalp clear  Chronic condition with duration or expected duration over one year. Currently well-controlled.   Treatment Plan: Continue clobetasol  solution to aa's scalp 1-2 times daily up to 5 days per week PRN. Avoid applying to face, groin, and axilla. Use as  directed. Long-term use can cause thinning of the skin. Continue Zoryve  cr to aa's every day PRN Samples of Vtama  x 3 given to patient to use once daily.  Lot # [id number]  Exp: 06/2023   Return in about 6 months (around 11/04/2023) for Psoriasis.  April Chang, RMA, am acting as scribe for April Liming, MD .   Documentation: I have reviewed the above documentation for accuracy and completeness, and I agree with the above.  April Liming, MD

## 2023-05-05 NOTE — Patient Instructions (Addendum)
 Treatment Plan: Continue clobetasol solution to aa's scalp 1-2 times daily up to 5 days per week as needed. Avoid applying to face, groin, and axilla. Use as directed. Long-term use can cause thinning of the skin. Continue Zoryve cream to aa's every day as needed.  Due to recent changes in healthcare laws, you may see results of your pathology and/or laboratory studies on MyChart before the doctors have had a chance to review them. We understand that in some cases there may be results that are confusing or concerning to you. Please understand that not all results are received at the same time and often the doctors may need to interpret multiple results in order to provide you with the best plan of care or course of treatment. Therefore, we ask that you please give [place] 2 business days to thoroughly review all your results before contacting the office for clarification. Should we see a critical lab result, you will be contacted sooner.   If You Need Anything After Your Visit  If you have any questions or concerns for your doctor, please call our main line at [phone] and press option 4 to reach your doctor's medical assistant. If no one answers, please leave a voicemail as directed and we will return your call as soon as possible. Messages left after 4 pm will be answered the following business day.   You may also send [place] a message via MyChart. We typically respond to MyChart messages within 1-2 business days.  For prescription refills, please ask your pharmacy to contact our office. Our fax number is [phone].  If you have an urgent issue when the clinic is closed that cannot wait until the next business day, you can page your doctor at the number below.    Please note that while we do our best to be available for urgent issues outside of office hours, we are not available 24/7.   If you have an urgent issue and are unable to reach [place], you may choose to seek medical care at your doctor's  office, retail clinic, urgent care center, or emergency room.  If you have a medical emergency, please immediately call 911 or go to the emergency department.  Pager Numbers  - Dr. Gwen Pounds: [phone]  - Dr. Roseanne Reno: [phone]  - Dr. Katrinka Blazing: [phone]   In the event of inclement weather, please call our main line at [phone] for an update on the status of any delays or closures.  Dermatology Medication Tips: Please keep the boxes that topical medications come in in order to help keep track of the instructions about where and how to use these. Pharmacies typically print the medication instructions only on the boxes and not directly on the medication tubes.   If your medication is too expensive, please contact our office at [phone] option 4 or send [place] a message through MyChart.   We are unable to tell what your co-pay for medications will be in advance as this is different depending on your insurance coverage. However, we may be able to find a substitute medication at lower cost or fill out paperwork to get insurance to cover a needed medication.   If a prior authorization is required to get your medication covered by your insurance company, please allow [place] 1-2 business days to complete this process.  Drug prices often vary depending on where the prescription is filled and some pharmacies may offer cheaper prices.  The website www.goodrx.com contains coupons for medications through different pharmacies. The prices  here do not account for what the cost may be with help from insurance (it may be cheaper with your insurance), but the website can give you the price if you did not use any insurance.  - You can print the associated coupon and take it with your prescription to the pharmacy.  - You may also stop by our office during regular business hours and pick up a GoodRx coupon card.  - If you need your prescription sent electronically to a different pharmacy, notify our  office through [facility] or by phone at [phone] option 4.     Si Usted Necesita Algo Despus de Su Visita  Tambin puede enviarnos un mensaje a travs de Clinical cytogeneticist. Por lo general respondemos a los mensajes de MyChart en el transcurso de 1 a 2 das hbiles.  Para renovar recetas, por favor pida a su farmacia que se ponga en contacto con nuestra oficina. Franz Jacks de fax es [place] [phone].  Si tiene un asunto urgente cuando la clnica est cerrada y que no puede esperar hasta el siguiente da hbil, puede llamar/localizar a su doctor(a) al nmero que aparece a continuacin.   Por favor, tenga en cuenta que aunque hacemos todo lo posible para estar disponibles para asuntos urgentes fuera del horario de [place], no estamos disponibles las 24 horas del da, los 7 [address]  [address] de la [place].   Si tiene un problema urgente y no puede comunicarse con nosotros, puede optar por buscar atencin mdica  en el consultorio de su doctor(a), en una clnica privada, en un centro de atencin urgente o en una sala de emergencias.  Si tiene Engineer, drilling, por favor llame inmediatamente al 911 o vaya a la sala de emergencias.  Nmeros de bper  - Dr. Bary Likes: [phone]  - Dra. Annette Barters: [phone]  - Dr. Felipe Horton: [phone]   En caso de inclemencias del tiempo, por favor llame a Lajuan Pila principal al [phone] para una actualizacin sobre el [place] de cualquier retraso o cierre.  Consejos para la medicacin en dermatologa: Por favor, guarde las cajas en las que vienen los medicamentos de uso tpico para ayudarle a seguir las instrucciones sobre dnde y cmo usarlos. Las farmacias generalmente imprimen las instrucciones del medicamento slo en las cajas y no directamente en los tubos del [place].   Si su medicamento es muy caro, por favor, pngase en contacto con Bettyjane Brunet llamando al [phone] y presione la opcin 4 o envenos un mensaje a travs de Clinical cytogeneticist.    No podemos decirle cul ser su copago por los medicamentos por adelantado ya que esto es diferente dependiendo de la cobertura de su seguro. Sin embargo, es posible que podamos encontrar un medicamento sustituto a Audiological scientist un formulario para que el seguro cubra el medicamento que se considera necesario.   Si se requiere una autorizacin previa para que su compaa de seguros [place] su medicamento, por favor permtanos de 1 a 2 das hbiles para completar este proceso.  Los precios de los medicamentos varan con frecuencia dependiendo del Environmental consultant de dnde se surte la receta y alguna farmacias pueden ofrecer precios ms baratos.  El sitio web www.goodrx.com tiene cupones para medicamentos de Health and safety inspector. Los precios aqu no tienen en cuenta lo que podra costar con la ayuda del seguro (puede ser ms barato con su seguro), pero el sitio web puede darle el precio si no utiliz Tourist information centre manager.  - Puede imprimir el cupn correspondiente y llevarlo con su receta a  la farmacia.  - Tambin puede pasar por nuestra oficina durante el horario de atencin regular y Education officer, museum una tarjeta de cupones de GoodRx.  - Si necesita que su receta se enve electrnicamente a una farmacia diferente, informe a nuestra oficina a travs de MyChart de [place] o por telfono llamando al [phone] y presione la opcin 4.

## 2023-05-10 ENCOUNTER — Encounter: Payer: Self-pay | Admitting: Dermatology

## 2023-05-13 ENCOUNTER — Other Ambulatory Visit: Payer: Self-pay | Admitting: Family

## 2023-05-14 ENCOUNTER — Telehealth: Payer: Self-pay | Admitting: Family

## 2023-05-14 NOTE — Progress Notes (Unsigned)
 Advanced Heart Failure Clinic Note    PCP: [facility] Primary Cardiologist: Sammy Crisp, MD (last seen 11/23)  Chief Complaint: shortness of breath  HPI:  April Chang is a 63 y/o female with a history of asthma, HTN, GERD, TBI, previous tobacco use and chronic heart failure.   Was in the ED 01/23/22 due to cough, edema and reported weight gain. Diuretic provided.   Echo 08/01/21: EF 60-65% Echo 01/03/23: EF 60-65% with Grade I DD  She presents today for a HF follow-up visit with a chief complaint of minimal shortness of breath with moderate exertion. Chronic in nature although her sister says that she notices an improvement in her breathing ever since starting jardiance . Has associated fatigue, headaches, occasional wheezing and left knee pain along with this. Denies chest pain, palpitations, abdominal distention, dizziness, pedal edema or difficulty sleeping.   Patient has TBI so sister is guardian and patient also has a caregiver.   No longer is doing home physical therapy because her insurance denied it.   ROS: All systems negative except as listed in HPI, PMH and Problem List.  SH:  Social History   Socioeconomic History   Marital status: Single    Spouse name: Not on file   Number of children: Not on file   Years of education: Not on file   Highest education level: Not on file  Occupational History   Not on file  Tobacco Use   Smoking status: Former    Current packs/day: 0.00    Types: Cigarettes    Quit date: 79    Years since quitting: 35.3   Smokeless tobacco: Not on file  Vaping Use   Vaping status: Never Used  Substance and Sexual Activity   Alcohol use: Not Currently    Comment: Former alcoholic   Drug use: Not Currently    Types: Cocaine, Marijuana    Comment: many years ago   Sexual activity: Not on file  Other Topics Concern   Not on file  Social History Narrative   Not on file   Social Drivers of Health   Financial  Resource Strain: Not on file  Food Insecurity: No Food Insecurity (12/11/2021)   Hunger Vital Sign    Worried About Running Out of Food in the Last Year: Never true    Ran Out of Food in the Last Year: Never true  Transportation Needs: No Transportation Needs (10/30/2021)   PRAPARE - Administrator, Civil Service (Medical): No    Lack of Transportation (Non-Medical): No  Physical Activity: Not on file  Stress: Not on file  Social Connections: Not on file  Intimate Partner Violence: Not on file    FH:  Family History  Problem Relation Age of Onset   Coronary artery disease Mother    Atrial fibrillation Father    Breast cancer Maternal Aunt     Past Medical History:  Diagnosis Date   Asthma    CHF (congestive heart failure) (HCC)    GERD (gastroesophageal reflux disease)    Hypertension    [place] spotted fever    TBI (traumatic brain injury) (HCC)     Current Outpatient Medications  Medication Sig Dispense Refill   aspirin EC 81 MG tablet Take 81 mg by mouth daily. Swallow whole.     clobetasol  (TEMOVATE ) 0.05 % external solution TO AFFECTED AREAS OF SCALP DAILY AS NEEDED UP TO 5D/WK. AVOID APPLYING TO FACE, GROIN, AND AXILLA. 50 mL 5  donepezil (ARICEPT) 5 MG tablet Take 5 mg by mouth 2 (two) times daily.     fluticasone (FLONASE) 50 MCG/ACT nasal spray Place 1 spray into both nostrils as needed for allergies or rhinitis.     furosemide  (LASIX ) 40 MG tablet TAKE 1 TABLET BY MOUTH DAILY AS NEEDED (Patient taking differently: Take 40 mg by mouth daily.) 90 tablet 1   gabapentin (NEURONTIN) 100 MG capsule Take 100 mg by mouth 2 (two) times daily.     JARDIANCE  10 MG TABS tablet TAKE 1 TABLET BY MOUTH DAILY BEFORE BREAKFAST. 30 tablet 5   KLOR-CON  M20 20 MEQ tablet TAKE 1 TABLET BY MOUTH EVERY DAY 90 tablet 3   levocetirizine (XYZAL) 5 MG tablet Take 5 mg by mouth daily.     lidocaine 4 % Place 1 patch onto the skin as needed (Back pain).     omeprazole  (PRILOSEC) 20 MG capsule Take 20 mg by mouth daily.     Roflumilast  (ZORYVE ) 0.3 % CREA Apply 1 Application topically daily. qd to aa psoriasis prn flares 60 g 6   VENTOLIN HFA 108 (90 Base) MCG/ACT inhaler Inhale 2 puffs into the lungs every 4 (four) hours as needed.     No current facility-administered medications for this visit.   There were no vitals filed for this visit.  Wt Readings from Last 3 Encounters:  01/10/23 157 lb 12.8 oz (71.6 kg)  12/02/22 156 lb (70.8 kg)  09/03/22 160 lb (72.6 kg)   Lab Results  Component Value Date   CREATININE 0.88 09/03/2022   CREATININE 0.66 07/02/2022   CREATININE 0.71 01/23/2022    PHYSICAL EXAM:  General:  Well appearing. No resp difficulty HEENT: normal Neck: supple. JVP flat. No lymphadenopathy or thryomegaly appreciated. Cor: PMI normal. Regular rate & rhythm. No rubs, gallops or murmurs. Lungs: clear Abdomen: soft, nontender, nondistended. No hepatosplenomegaly. No bruits or masses.  Extremities: no cyanosis, clubbing, rash, edema Neuro: alert & orientedx3, cranial nerves grossly intact. Moves all 4 extremities w/o difficulty. Affect pleasant.   ECG: not done   ASSESSMENT & PLAN:  1: NICM with preserved ejection fraction- - likely due to HTN - NYHA class II - euvolemic  - weighing daily; reminded to call for an overnight weight gain of > 2 pounds or a weekly weight gain of > 5 pounds - weight stable from last visit here 1 month ago - Echo 08/01/21 showed an EF of 60-65% - Echo 01/03/23: EF 60-65% with Grade I DD - not adding salt and uses salt substitute only - saw cardiology (End) 11/23 - continue furosemide  40mg  daily  - continue jardiance  10mg  daily - continue potassium 10meq daily - could consider adding spironolactone if BP maintains - BNP 07/27/21 was 15/3  2: HTN- - BP 121/73 - sees PCP @ [facility] - BMP 09/03/22 showed sodium 142, potassium 3.9, creatinine 0.88 and GFR 75 - BMET next week as  she says that she can't stay to do it today  3: TBI- - sister is caregiver - saw neurology Elwin Hammond) 08/24 - no longer getting physical therapy due to insurance issues  4: Snoring- - patient endorses waking up fatigued at times although less so - sister confirms that patient snores and has woken herself sometimes gasping - sister says that she returned the inhome sleep study but she questions the accuracy as when she woke up, parts had become disconnected - if we need to repeat it, may need to be at the  sleep lab   Return in 4 months, sooner if needed.      Charlette Console, FNP 05/14/23

## 2023-05-14 NOTE — Telephone Encounter (Signed)
 Called to confirm/remind patient of their appointment at the Advanced Heart Failure Clinic on 05/15/23.   Appointment:   [x] Confirmed  [] Left mess   [] No answer/No voice mail  [] VM Full/unable to leave message  [] Phone not in service  Patient reminded to bring all medications and/or complete list.  Confirmed patient has transportation. Gave directions, instructed to utilize valet parking.

## 2023-05-15 ENCOUNTER — Ambulatory Visit: Payer: Medicaid Other | Attending: Family | Admitting: Family

## 2023-05-15 ENCOUNTER — Encounter: Payer: Self-pay | Admitting: Family

## 2023-05-15 VITALS — BP 122/80 | HR 79 | Wt 151.6 lb

## 2023-05-15 DIAGNOSIS — K219 Gastro-esophageal reflux disease without esophagitis: Secondary | ICD-10-CM | POA: Diagnosis not present

## 2023-05-15 DIAGNOSIS — I5032 Chronic diastolic (congestive) heart failure: Secondary | ICD-10-CM | POA: Diagnosis not present

## 2023-05-15 DIAGNOSIS — M7989 Other specified soft tissue disorders: Secondary | ICD-10-CM

## 2023-05-15 DIAGNOSIS — R0602 Shortness of breath: Secondary | ICD-10-CM | POA: Diagnosis present

## 2023-05-15 DIAGNOSIS — S069XAS Unspecified intracranial injury with loss of consciousness status unknown, sequela: Secondary | ICD-10-CM | POA: Insufficient documentation

## 2023-05-15 DIAGNOSIS — I110 Hypertensive heart disease with heart failure: Secondary | ICD-10-CM | POA: Diagnosis not present

## 2023-05-15 DIAGNOSIS — I10 Essential (primary) hypertension: Secondary | ICD-10-CM | POA: Diagnosis not present

## 2023-05-15 DIAGNOSIS — I428 Other cardiomyopathies: Secondary | ICD-10-CM | POA: Diagnosis not present

## 2023-05-15 DIAGNOSIS — X58XXXS Exposure to other specified factors, sequela: Secondary | ICD-10-CM | POA: Diagnosis not present

## 2023-05-15 DIAGNOSIS — Z87891 Personal history of nicotine dependence: Secondary | ICD-10-CM | POA: Diagnosis not present

## 2023-05-15 DIAGNOSIS — Z79899 Other long term (current) drug therapy: Secondary | ICD-10-CM | POA: Insufficient documentation

## 2023-05-15 DIAGNOSIS — J45909 Unspecified asthma, uncomplicated: Secondary | ICD-10-CM | POA: Insufficient documentation

## 2023-05-15 MED ORDER — FUROSEMIDE 20 MG PO TABS: 20.0000 mg | ORAL_TABLET | Freq: Every day | ORAL | 3 refills | Status: AC

## 2023-05-15 NOTE — Patient Instructions (Signed)
 Medication Changes:  CHANGE Furosemide  20mg  daily with a additional 20mg  as needed for swelling  Lab Work:  Go DOWN to LOWER LEVEL (LL) to have your blood work completed inside of [employer] office.   We will only call you if the results are abnormal or if the provider would like to make medication changes.  You have been referred to [facility] in [place]. That office will contact you in order to schedule an appointment.     Follow-Up in: Please follow up with the Advanced Heart Failure Clinic in 6 months with Shawnee Dellen, FNP.  At the Advanced Heart Failure Clinic, you and your health needs are our priority. We have a designated team specialized in the treatment of Heart Failure. This Care Team includes your primary Heart Failure Specialized Cardiologist (physician), Advanced Practice Providers (APPs- Physician Assistants and Nurse Practitioners), and Pharmacist who all work together to provide you with the care you need, when you need it.   You may see any of the following providers on your designated Care Team at your next follow up:  Dr. Jules Oar Dr. Peder Bourdon Dr. Alwin Baars Dr. Judyth Nunnery Shawnee Dellen, FNP Bevely Brush, RPH-CPP  Please be sure to bring in all your medications bottles to every appointment.   Need to Contact Us :  If you have any questions or concerns before your next appointment please send us  a message through [place] or call our office at [phone].    TO LEAVE A MESSAGE FOR THE NURSE SELECT OPTION 2, PLEASE LEAVE A MESSAGE INCLUDING: YOUR NAME DATE OF BIRTH CALL BACK NUMBER REASON FOR CALL**this is important as we prioritize the call backs  YOU WILL RECEIVE A CALL BACK THE SAME DAY AS LONG AS YOU CALL BEFORE 4:00 PM

## 2023-05-16 ENCOUNTER — Encounter: Payer: Self-pay | Admitting: Family

## 2023-05-16 LAB — BASIC METABOLIC PANEL WITH GFR
BUN/Creatinine Ratio: 23 (ref 12–28)
BUN: 16 mg/dL (ref 8–27)
CO2: 24 mmol/L (ref 20–29)
Calcium: 9.5 mg/dL (ref 8.7–10.3)
Chloride: 105 mmol/L (ref 96–106)
Creatinine, Ser: 0.71 mg/dL (ref 0.57–1.00)
Glucose: 83 mg/dL (ref 70–99)
Potassium: 4.9 mmol/L (ref 3.5–5.2)
Sodium: 144 mmol/L (ref 134–144)
eGFR: 96 mL/min/1.73 (ref >59)

## 2023-06-12 ENCOUNTER — Encounter: Payer: Self-pay | Admitting: Family

## 2023-06-12 DIAGNOSIS — S069XAS Unspecified intracranial injury with loss of consciousness status unknown, sequela: Secondary | ICD-10-CM

## 2023-06-12 DIAGNOSIS — M5416 Radiculopathy, lumbar region: Secondary | ICD-10-CM

## 2023-06-12 DIAGNOSIS — I5032 Chronic diastolic (congestive) heart failure: Secondary | ICD-10-CM

## 2023-06-12 DIAGNOSIS — R262 Difficulty in walking, not elsewhere classified: Secondary | ICD-10-CM

## 2023-06-18 ENCOUNTER — Other Ambulatory Visit: Payer: Self-pay | Admitting: Family

## 2023-06-20 ENCOUNTER — Encounter: Payer: Self-pay | Admitting: Family

## 2023-06-22 ENCOUNTER — Other Ambulatory Visit: Payer: Self-pay | Admitting: Family

## 2023-06-22 MED ORDER — EMPAGLIFLOZIN 10 MG PO TABS: 10.0000 mg | ORAL_TABLET | Freq: Every day | ORAL | 5 refills | Status: DC

## 2023-06-30 ENCOUNTER — Ambulatory Visit

## 2023-07-08 ENCOUNTER — Ambulatory Visit

## 2023-07-10 ENCOUNTER — Ambulatory Visit

## 2023-07-15 ENCOUNTER — Ambulatory Visit

## 2023-07-17 ENCOUNTER — Ambulatory Visit

## 2023-07-21 ENCOUNTER — Ambulatory Visit

## 2023-07-21 ENCOUNTER — Other Ambulatory Visit (HOSPITAL_COMMUNITY): Payer: Self-pay

## 2023-07-22 ENCOUNTER — Ambulatory Visit

## 2023-07-28 ENCOUNTER — Other Ambulatory Visit (HOSPITAL_COMMUNITY): Payer: Self-pay

## 2023-07-29 ENCOUNTER — Ambulatory Visit

## 2023-08-05 ENCOUNTER — Ambulatory Visit

## 2023-08-05 ENCOUNTER — Encounter: Payer: Self-pay | Admitting: *Deleted

## 2023-08-06 ENCOUNTER — Ambulatory Visit: Attending: Internal Medicine | Admitting: Internal Medicine

## 2023-08-06 ENCOUNTER — Encounter: Payer: Self-pay | Admitting: Internal Medicine

## 2023-08-06 VITALS — BP 130/80 | HR 77 | Ht 62.0 in | Wt 148.0 lb

## 2023-08-06 DIAGNOSIS — Z79899 Other long term (current) drug therapy: Secondary | ICD-10-CM | POA: Insufficient documentation

## 2023-08-06 DIAGNOSIS — R5383 Other fatigue: Secondary | ICD-10-CM | POA: Insufficient documentation

## 2023-08-06 DIAGNOSIS — R400 Somnolence: Secondary | ICD-10-CM | POA: Insufficient documentation

## 2023-08-06 DIAGNOSIS — I5032 Chronic diastolic (congestive) heart failure: Secondary | ICD-10-CM | POA: Diagnosis present

## 2023-08-06 DIAGNOSIS — I10 Essential (primary) hypertension: Secondary | ICD-10-CM

## 2023-08-06 NOTE — Patient Instructions (Signed)
 Medication Instructions:  Your physician recommends that you continue on your current medications as directed. Please refer to the Current Medication list given to you today.   *If you need a refill on your cardiac medications before your next appointment, please call your pharmacy*  Lab Work: Your provider would like for you to return in next few days to have the following labs drawn: CBC w/ differential, TSH.   Please go to [facility] [address] [address], [place] [address] You do not need an appointment.  They are open from 8 am- 4:30 pm.  Lunch from 1:00 pm- 2:00 pm You do not need to be fasting.  If you have labs (blood work) drawn today and your tests are completely normal, you will receive your results only by: MyChart Message (if you have MyChart) OR A paper copy in the mail If you have any lab test that is abnormal or we need to change your treatment, we will call you to review the results.  Testing/Procedures: No test ordered today   Follow-Up: At [facility], you and your health needs are our priority.  As part of our continuing mission to provide you with exceptional heart care, our providers are all part of one team.  This team includes your primary Cardiologist (physician) and Advanced Practice Providers or APPs (Physician Assistants and Nurse Practitioners) who all work together to provide you with the care you need, when you need it.  Your next appointment:   3 month(s)  Provider:   You may see Lonni Hanson, MD or one of the following Advanced Practice Providers on your designated Care Team:   Lonni Meager, NP Lesley Maffucci, PA-C Bernardino Bring, PA-C Cadence Heuvelton, PA-C Tylene Lunch, NP Barnie Hila, NP    We recommend signing up for the patient portal called MyChart.  Sign up information is provided on this After Visit Summary.  MyChart is used to connect with patients for Virtual Visits (Telemedicine).   Patients are able to view lab/test results, encounter notes, upcoming appointments, etc.  Non-urgent messages can be sent to your provider as well.   To learn more about what you can do with MyChart, go to [email or web address].

## 2023-08-06 NOTE — Progress Notes (Unsigned)
 Cardiology Office Note:  .   Date:  08/07/2023  ID:  April Chang, DOB [birth year], MRN [id number] PCP: [facility]  [facility] Providers Cardiologist:  Lonni Hanson, MD     History of Present Illness: .   April Chang is a 63 y.o. female with history of diastolic dysfunction, hypertension, GERD, traumatic brain injury, and psoriasis, who presents for follow-up of chronic lower extremity edema.  She presents with her guardian, who provides much of the history.  I last saw her in 11/2021, at which time she was doing fairly well with only minimal leg swelling.  She has subsequently followed with Ellouise Class, NP, in the heart failure clinic.  At her last visit in 04/2023, she endorsed exertional dyspnea and fatigue.  Preceding echo in 11/2022 showed normal LVEF with grade 1 diastolic dysfunction and no significant valvular abnormalities.  Today, April Chang's primary concern is fatigue and sleep issues.  She sometimes does not sleep at night for 2 to 3 days at a time and then dozes throughout much of the day.  She had been placed on trazodone by another provider, with her caregiver wondering if lingering effects of this could be contributing to her sleep disturbance.  For the last 3 days, it seems like April Chang has only wanted to sleep during the day.  These episodes have been going on for months without obvious precipitants.  She has not had any chest pain, shortness of breath, palpitations, or lightheadedness.  Edema is well-controlled with dapagliflozin and furosemide .  Her mobility remains quite limited.  She has not had any falls or syncope.  April Chang had undergone a home sleep study ordered by Ellouise Class NP, a year ago.  However, she never heard the results nor are they available for review in her chart.  ROS: See HPI  Studies Reviewed: SABRA   EKG Interpretation Date/Time:  Wednesday August 06 2023 16:39:20 EDT Ventricular Rate:  77 PR  Interval:  168 QRS Duration:  76 QT Interval:  394 QTC Calculation: 445 R Axis:   50  Text Interpretation: Normal sinus rhythm Low voltage QRS Borderline ECG When compared with ECG of 23-Jan-2022 10:15, No significant change was found Confirmed by Vaniya Augspurger [phone]) on 08/07/2023 7:53:11 PM    TTE in 01/03/2023: Normal LV size and wall thickness.  LVEF 60-65% with normal wall motion and grade 1 diastolic dysfunction.  Normal RV size and function.  Normal biatrial size.  No pericardial effusion.  No significant valvular abnormality.  Normal CVP.  Risk Assessment/Calculations:             Physical Exam:   VS:  BP 130/80 (BP Location: Left Arm, Patient Position: Sitting, Cuff Size: Normal)   Pulse 77   Ht 5' 2 (1.575 m)   Wt 148 lb (67.1 kg)   SpO2 98%   BMI 27.07 kg/m    Wt Readings from Last 3 Encounters:  08/06/23 148 lb (67.1 kg)  05/15/23 151 lb 9.6 oz (68.8 kg)  01/10/23 157 lb 12.8 oz (71.6 kg)    General:  NAD. Neck: No JVD or HJR. Lungs: Clear to auscultation bilaterally without wheezes or crackles. Heart: Regular rate and rhythm without murmurs, rubs, or gallops. Abdomen: Soft, nontender, nondistended. Extremities: No lower extremity edema.  ASSESSMENT AND PLAN: .    Chronic HFpEF: April Chang has a history of chronic lower extremity IMA with recent echocardiogram again showing grade 1 diastolic dysfunction.  She appears fairly euvolemic to  me.  Is difficult to assess her functional status given her TBI and limited baseline activity.  I think is reasonable to continue her current regimen of empagliflozin  and furosemide , with ongoing follow-up in the heart failure clinic.  Fatigue/somnolence: I suspect some underlying sleep disturbance and sundowning is contributing to daytime fatigue/somnolence.  We discussed the importance of trying to stay awake during the day and establishing good sleep hygiene at night.  We will reach out to [facility] to request results of home  sleep study from a year ago.  If results are not available, we will need to consider repeating this to exclude sleep apnea.  I will also have April Chang return at her convenience to check a CBC and TSH.    Dispo: Return to clinic in 3 months.  Signed, Lonni Hanson, MD

## 2023-08-07 ENCOUNTER — Telehealth: Payer: Self-pay

## 2023-08-07 ENCOUNTER — Encounter: Payer: Self-pay | Admitting: Internal Medicine

## 2023-08-07 DIAGNOSIS — R400 Somnolence: Secondary | ICD-10-CM | POA: Insufficient documentation

## 2023-08-07 DIAGNOSIS — I5032 Chronic diastolic (congestive) heart failure: Secondary | ICD-10-CM | POA: Insufficient documentation

## 2023-08-07 DIAGNOSIS — R5383 Other fatigue: Secondary | ICD-10-CM | POA: Insufficient documentation

## 2023-08-07 NOTE — Telephone Encounter (Signed)
**Note De-Identified Joshlynn Alfonzo Obfuscation** I called the pt to get her insurance card information as we have no insurance on file for her currently. I got no answer so I left a message on her VM asking her to call Macario back at [facility] at [phone].

## 2023-08-07 NOTE — Telephone Encounter (Signed)
Patient's sister is returning call. 

## 2023-08-08 ENCOUNTER — Telehealth: Payer: Self-pay

## 2023-08-08 DIAGNOSIS — R0683 Snoring: Secondary | ICD-10-CM

## 2023-08-08 NOTE — Telephone Encounter (Signed)
**Note De-Identified Maxine Huynh Obfuscation** The pts sister states that the pt does not have the WatchPAT One-HST device any longer as she was advised that it had to be returned to [facility] so sh returned it.  There is a order for a [facility]-HST and a Split Night Sleep Study in the pts active orders. Both were ordered by Ellouise Class, NP at [facility]. We do not do sleep PAs for the CHF clinics as they handle their own.  Forwarding back to Dr Ulysses nurse.

## 2023-08-08 NOTE — Telephone Encounter (Signed)
 Per Ellouise Class, FNP, order, ov note, and demos faxed to [facility].

## 2023-08-12 ENCOUNTER — Ambulatory Visit

## 2023-08-14 ENCOUNTER — Telehealth: Payer: Self-pay | Admitting: Internal Medicine

## 2023-08-14 NOTE — Telephone Encounter (Signed)
 Sister Orrin) stated patient has PCP appointment tomorrow (7/25) and will be having blood drawn.  Sister wants lab orders from Dr. Mady sent to PCP's office at fax# [phone].

## 2023-08-14 NOTE — Telephone Encounter (Signed)
 Orders faxed to number provided.  Called and advised that orders had been faxed and to let us  know if they have any further questions.

## 2023-08-19 ENCOUNTER — Ambulatory Visit

## 2023-08-20 ENCOUNTER — Other Ambulatory Visit: Payer: Self-pay | Admitting: Nurse Practitioner

## 2023-08-20 DIAGNOSIS — Z1231 Encounter for screening mammogram for malignant neoplasm of breast: Secondary | ICD-10-CM

## 2023-08-26 ENCOUNTER — Encounter: Payer: Self-pay | Admitting: Internal Medicine

## 2023-08-26 ENCOUNTER — Ambulatory Visit

## 2023-08-28 ENCOUNTER — Ambulatory Visit

## 2023-08-28 ENCOUNTER — Telehealth: Payer: Self-pay

## 2023-08-28 NOTE — Telephone Encounter (Signed)
 Received fax from [facility] requesting ov note from last 6 months. Called to make aware that most recent ov note does not mention sleep study. Faxed most recent ov note per request.

## 2023-09-02 ENCOUNTER — Ambulatory Visit: Admitting: Physical Therapy

## 2023-09-02 ENCOUNTER — Ambulatory Visit

## 2023-09-04 ENCOUNTER — Ambulatory Visit

## 2023-09-09 ENCOUNTER — Ambulatory Visit

## 2023-09-11 ENCOUNTER — Ambulatory Visit

## 2023-09-16 ENCOUNTER — Ambulatory Visit

## 2023-09-23 ENCOUNTER — Ambulatory Visit

## 2023-09-30 ENCOUNTER — Ambulatory Visit

## 2023-10-01 ENCOUNTER — Telehealth: Payer: Self-pay

## 2023-10-01 NOTE — Telephone Encounter (Signed)
 Received fax from [facility] stating that pt refuses sleep study at this time.

## 2023-10-07 ENCOUNTER — Ambulatory Visit

## 2023-10-14 ENCOUNTER — Ambulatory Visit

## 2023-10-21 ENCOUNTER — Ambulatory Visit

## 2023-10-28 ENCOUNTER — Ambulatory Visit

## 2023-11-04 ENCOUNTER — Ambulatory Visit: Admitting: Dermatology

## 2023-11-04 ENCOUNTER — Encounter: Payer: Self-pay | Admitting: Dermatology

## 2023-11-04 ENCOUNTER — Ambulatory Visit

## 2023-11-04 DIAGNOSIS — L409 Psoriasis, unspecified: Secondary | ICD-10-CM | POA: Diagnosis not present

## 2023-11-04 DIAGNOSIS — L304 Erythema intertrigo: Secondary | ICD-10-CM

## 2023-11-04 MED ORDER — HYDROCORTISONE 2.5 % EX CREA: TOPICAL_CREAM | Freq: Two times a day (BID) | CUTANEOUS | 6 refills | Status: AC | PRN

## 2023-11-04 MED ORDER — KETOCONAZOLE 2 % EX CREA: TOPICAL_CREAM | CUTANEOUS | 5 refills | Status: AC

## 2023-11-04 NOTE — Progress Notes (Unsigned)
   Follow-Up Visit   Subjective  April Chang is a 63 y.o. female who presents for the following: 44-month psoriasis follow-up. Patient reports flaring pretty bad at groin area unsure if depends are rubbing skin raw. Using Zoryve  but not helping much. Patient states she liked vtama  a lot better but too expensive with insurance. Reports scalp has been itching, no flaking using clobetasol  solution, but not helping with itch. Knot at left upper arm.  Patient accompanied by sister April Chang who contributes to history.     The following portions of the chart were reviewed this encounter and updated as appropriate: medications, allergies, medical history  Review of Systems:  No other skin or systemic complaints except as noted in HPI or Assessment and Plan.  Objective  Well appearing patient in no apparent distress; mood and affect are within normal limits.  A focused examination was performed of the following areas: Scalp, groin  Relevant exam findings are noted in the Assessment and Plan.    Assessment & Plan   PSORIASIS Exam: clear on scalp  Chronic and persistent condition with duration or expected duration over one year. Condition is bothersome/symptomatic for patient. Currently flared.   Psoriasis is a chronic non-curable, but treatable genetic/hereditary disease that may have other systemic features affecting other organ systems such as joints (Psoriatic Arthritis). It is associated with an increased risk of inflammatory bowel disease, heart disease, non-alcoholic fatty liver disease, and depression.  Treatments include light and laser treatments; topical medications; and systemic medications including oral and injectables.  Treatment Plan: Continue clobetasol  BID prn  Discussed talking with provider prescribing gabapentin about increasing dosage.   INTERTRIGO Exam: Erythematous macerated patches in inguinal creases  Chronic and persistent condition with duration or  expected duration over one year. Condition is bothersome/symptomatic for patient. Currently flared.   Intertrigo is a chronic recurrent rash that occurs in skin fold areas that may be associated with friction; heat; moisture; yeast; fungus; and bacteria.  It is exacerbated by increased movement / activity; sweating; and higher atmospheric temperature.  Use of an absorbant powder such as Zeasorb AF powder or other OTC antifungal powder to the area daily can prevent rash recurrence. Other options to help keep the area dry include blow drying the area after bathing or using antiperspirant products such as Duradry sweat minimizing gel.  Treatment Plan: Recommend Desitin or zinc paste for barrier at night to prevent irritation from waste Start hydrocortisone 2.5% cream. And  Start ketoconazole 2% cream. May mix both together and apply BID.  Preferred desonide but does not appear to be covered INTERTRIGO   PSORIASIS   Related Medications clobetasol  (TEMOVATE ) 0.05 % external solution TO AFFECTED AREAS OF SCALP DAILY AS NEEDED UP TO 5D/WK. AVOID APPLYING TO FACE, GROIN, AND AXILLA. Roflumilast  (ZORYVE ) 0.3 % CREA Apply 1 Application topically daily. qd to aa psoriasis prn flares  Return in about 6 months (around 05/04/2024) for Psoriasis, w/ Dr. Claudene.  I, Jacquelynn V. Wilfred, CMA, am acting as scribe for Boneta Claudene, MD .   Documentation: I have reviewed the above documentation for accuracy and completeness, and I agree with the above.  Boneta Claudene, MD

## 2023-11-04 NOTE — Patient Instructions (Addendum)
 Recommend over the counter Desitin or zinc paste.    Due to recent changes in healthcare laws, you may see results of your pathology and/or laboratory studies on MyChart before the doctors have had a chance to review them. We understand that in some cases there may be results that are confusing or concerning to you. Please understand that not all results are received at the same time and often the doctors may need to interpret multiple results in order to provide you with the best plan of care or course of treatment. Therefore, we ask that you please give us  2 business days to thoroughly review all your results before contacting the office for clarification. Should we see a critical lab result, you will be contacted sooner.   If You Need Anything After Your Visit  If you have any questions or concerns for your doctor, please call our main line at [phone] and press option 4 to reach your doctor's medical assistant. If no one answers, please leave a voicemail as directed and we will return your call as soon as possible. Messages left after 4 pm will be answered the following business day.   You may also send us  a message via MyChart. We typically respond to MyChart messages within 1-2 business days.  For prescription refills, please ask your pharmacy to contact our office. Our fax number is [phone].  If you have an urgent issue when the clinic is closed that cannot wait until the next business day, you can page your doctor at the number below.    Please note that while we do our best to be available for urgent issues outside of office hours, we are not available 24/7.   If you have an urgent issue and are unable to reach us , you may choose to seek medical care at your doctor's office, retail clinic, urgent care center, or emergency room.  If you have a medical emergency, please immediately call 911 or go to the emergency department.  Pager Numbers  - Dr. Hester: [phone]  -  Dr. Jackquline: [phone]  - Dr. Claudene: [phone]   In the event of inclement weather, please call our main line at [phone] for an update on the status of any delays or closures.  Dermatology Medication Tips: Please keep the boxes that topical medications come in in order to help keep track of the instructions about where and how to use these. Pharmacies typically print the medication instructions only on the boxes and not directly on the medication tubes.   If your medication is too expensive, please contact our office at [phone] option 4 or send us  a message through MyChart.   We are unable to tell what your co-pay for medications will be in advance as this is different depending on your insurance coverage. However, we may be able to find a substitute medication at lower cost or fill out paperwork to get insurance to cover a needed medication.   If a prior authorization is required to get your medication covered by your insurance company, please allow us  1-2 business days to complete this process.  Drug prices often vary depending on where the prescription is filled and some pharmacies may offer cheaper prices.  The website www.goodrx.com contains coupons for medications through different pharmacies. The prices here do not account for what the cost may be with help from insurance (it may be cheaper with your insurance), but the website can give you the price if you did not use any insurance.  -  You can print the associated coupon and take it with your prescription to the pharmacy.  - You may also stop by our office during regular business hours and pick up a GoodRx coupon card.  - If you need your prescription sent electronically to a different pharmacy, notify our office through [facility] or by phone at [phone] option 4.     Si Usted Necesita Algo Despus de Su Visita  Tambin puede enviarnos un mensaje a travs de Clinical cytogeneticist. Por lo general respondemos a los  mensajes de MyChart en el transcurso de 1 a 2 das hbiles.  Para renovar recetas, por favor pida a su farmacia que se ponga en contacto con nuestra oficina. Randi lakes de fax es [place] [phone].  Si tiene un asunto urgente cuando la clnica est cerrada y que no puede esperar hasta el siguiente da hbil, puede llamar/localizar a su doctor(a) al nmero que aparece a continuacin.   Por favor, tenga en cuenta que aunque hacemos todo lo posible para estar disponibles para asuntos urgentes fuera del horario de [place], no estamos disponibles las 24 horas del da, los 7 [address]  [address] de la [place].   Si tiene un problema urgente y no puede comunicarse con nosotros, puede optar por buscar atencin mdica  en el consultorio de su doctor(a), en una clnica privada, en un centro de atencin urgente o en una sala de emergencias.  Si tiene Engineer, drilling, por favor llame inmediatamente al 911 o vaya a la sala de emergencias.  Nmeros de bper  - Dr. Hester: [phone]  - Dra. Jackquline: [phone]  - Dr. Claudene: [phone]   En caso de inclemencias del tiempo, por favor llame a landry capes principal al [phone] para una actualizacin sobre el [place] de cualquier retraso o cierre.  Consejos para la medicacin en dermatologa: Por favor, guarde las cajas en las que vienen los medicamentos de uso tpico para ayudarle a seguir las instrucciones sobre dnde y cmo usarlos. Las farmacias generalmente imprimen las instrucciones del medicamento slo en las cajas y no directamente en los tubos del [place].   Si su medicamento es muy caro, por favor, pngase en contacto con landry rieger llamando al [phone] y presione la opcin 4 o envenos un mensaje a travs de Clinical cytogeneticist.   No podemos decirle cul ser su copago por los medicamentos por adelantado ya que esto es diferente dependiendo de la cobertura de su seguro. Sin embargo, es posible que podamos encontrar un medicamento sustituto a  Audiological scientist un formulario para que el seguro cubra el medicamento que se considera necesario.   Si se requiere una autorizacin previa para que su compaa de seguros malta su medicamento, por favor permtanos de 1 a 2 das hbiles para completar este proceso.  Los precios de los medicamentos varan con frecuencia dependiendo del Environmental consultant de dnde se surte la receta y alguna farmacias pueden ofrecer precios ms baratos.  El sitio web www.goodrx.com tiene cupones para medicamentos de Health and safety inspector. Los precios aqu no tienen en cuenta lo que podra costar con la ayuda del seguro (puede ser ms barato con su seguro), pero el sitio web puede darle el precio si no utiliz Tourist information centre manager.  - Puede imprimir el cupn correspondiente y llevarlo con su receta a la farmacia.  - Tambin puede pasar por nuestra oficina durante el horario de atencin regular y Education officer, museum una tarjeta de cupones de GoodRx.  - Si necesita que su receta se enve electrnicamente a [employer],  informe a nuestra oficina a travs de MyChart de [place] o por telfono llamando al [phone] y presione la opcin 4.

## 2023-11-11 ENCOUNTER — Ambulatory Visit

## 2023-11-12 ENCOUNTER — Telehealth: Payer: Self-pay | Admitting: Family

## 2023-11-12 NOTE — Progress Notes (Unsigned)
 Advanced Heart Failure Clinic Note    PCP: [facility] Primary Cardiologist: April Bruckner, MD (last seen 11/23)  Chief Complaint: shortness of breath   HPI:  April Chang is a 63 y/o female with a history of asthma, HTN, GERD, TBI, previous tobacco use and chronic heart failure.   Echo 08/01/21: EF 60-65%  Was in the ED 01/23/22 due to cough, edema and reported weight gain. Diuretic provided.   Echo 01/03/23: EF 60-65% with Grade I DD  She presents today, with her sister, for a HF follow-up visit with a chief complaint of shortness of breath with exertion. Has associated fatigue along with this. Denies chest pain, palpitations, abdominal distention, pedal edema, dizziness, weight gain or difficulty sleeping. Occasionally has a decreased appetite but will then drink ensure. Did eat out at [facility] yesterday for her birthday and says that the steak & fries tasted salty to her.    Patient has TBI so sister is guardian and patient also has a caregiver.    ROS: All systems negative except as listed in HPI, PMH and Problem List.  SH:  Social History   Socioeconomic History   Marital status: Single    Spouse name: Not on file   Number of children: Not on file   Years of education: Not on file   Highest education level: Not on file  Occupational History   Not on file  Tobacco Use   Smoking status: Former    Current packs/day: 0.00    Types: Cigarettes    Quit date: 67    Years since quitting: 35.8   Smokeless tobacco: Not on file  Vaping Use   Vaping status: Never Used  Substance and Sexual Activity   Alcohol use: Not Currently    Comment: Former alcoholic   Drug use: Not Currently    Types: Cocaine, Marijuana    Comment: many years ago   Sexual activity: Not on file  Other Topics Concern   Not on file  Social History Narrative   Not on file   Social Drivers of Health   Financial Resource Strain: Not on file  Food Insecurity: No  Food Insecurity (12/11/2021)   Hunger Vital Sign    Worried About Running Out of Food in the Last Year: Never true    Ran Out of Food in the Last Year: Never true  Transportation Needs: No Transportation Needs (10/30/2021)   PRAPARE - Administrator, Civil Service (Medical): No    Lack of Transportation (Non-Medical): No  Physical Activity: Not on file  Stress: Not on file  Social Connections: Not on file  Intimate Partner Violence: Not on file    FH:  Family History  Problem Relation Age of Onset   Coronary artery disease Mother    Atrial fibrillation Father    Breast cancer Maternal Aunt     Past Medical History:  Diagnosis Date   Asthma    CHF (congestive heart failure) (HCC)    GERD (gastroesophageal reflux disease)    Hypertension    [place] spotted fever    TBI (traumatic brain injury) (HCC)     Current Outpatient Medications  Medication Sig Dispense Refill   aspirin EC 81 MG tablet Take 81 mg by mouth daily. Swallow whole.     clobetasol  (TEMOVATE ) 0.05 % external solution TO AFFECTED AREAS OF SCALP DAILY AS NEEDED UP TO 5D/WK. AVOID APPLYING TO FACE, GROIN, AND AXILLA. 50 mL 5   donepezil (ARICEPT)  5 MG tablet Take 5 mg by mouth 2 (two) times daily.     empagliflozin  (JARDIANCE ) 10 MG TABS tablet Take 1 tablet (10 mg total) by mouth daily before breakfast. 30 tablet 5   fluticasone (FLONASE) 50 MCG/ACT nasal spray Place 1 spray into both nostrils as needed for allergies or rhinitis.     furosemide  (LASIX ) 20 MG tablet Take 1 tablet (20 mg total) by mouth daily. And additional 20mg  PRN 90 tablet 3   gabapentin (NEURONTIN) 100 MG capsule Take 100 mg by mouth 2 (two) times daily.     hydrocortisone 2.5 % cream Apply topically 2 (two) times daily as needed (Rash). 30 g 6   ketoconazole (NIZORAL) 2 % cream Apply topically BID. 60 g 5   KLOR-CON  M20 20 MEQ tablet TAKE 1 TABLET BY MOUTH EVERY DAY (Patient taking differently: Take 10 mEq by mouth daily.)  90 tablet 3   levocetirizine (XYZAL) 5 MG tablet Take 5 mg by mouth daily. (Patient not taking: Reported on 08/06/2023)     lidocaine 4 % Place 1 patch onto the skin as needed (Back pain).     omeprazole (PRILOSEC) 20 MG capsule Take 20 mg by mouth daily.     Roflumilast  (ZORYVE ) 0.3 % CREA Apply 1 Application topically daily. qd to aa psoriasis prn flares 60 g 6   traZODone (DESYREL) 50 MG tablet Take 1.5 mg by mouth as needed for sleep.     VENTOLIN HFA 108 (90 Base) MCG/ACT inhaler Inhale 2 puffs into the lungs every 4 (four) hours as needed.     No current facility-administered medications for this visit.   There were no vitals filed for this visit.  Wt Readings from Last 3 Encounters:  08/06/23 148 lb (67.1 kg)  05/15/23 151 lb 9.6 oz (68.8 kg)  01/10/23 157 lb 12.8 oz (71.6 kg)   Lab Results  Component Value Date   CREATININE 0.71 05/15/2023   CREATININE 0.88 09/03/2022   CREATININE 0.66 07/02/2022    PHYSICAL EXAM:  General: Well appearing in wheelchair. No resp difficulty HEENT: normal Neck: supple, no JVD Cor: Regular rhythm, rate. No rubs, gallops or murmurs Lungs: clear Abdomen: soft, nontender, nondistended. Extremities: no cyanosis, clubbing, rash, edema Neuro: alert & oriented X 3. Has chronic brace on right lower leg Affect pleasant   ECG: not done   ASSESSMENT & PLAN:  1: NICM with preserved ejection fraction- - likely due to HTN - NYHA [name] - euvolemic  - weighing daily; reminded to call for an overnight weight gain of > 2 pounds or a weekly weight gain of > 5 pounds - weight down 6 pounds from last visit here 4 months ago - Echo 08/01/21 showed an EF of 60-65% - Echo 01/03/23: EF 60-65% with Grade I DD - not adding salt and uses salt substitute only - saw cardiology (End) 11/23; will make referral back to his office for f/u visit - continue furosemide  20mg  daily w/ additional 20mg  PRN - continue jardiance  10mg  daily - continue potassium 10meq  daily - BNP 07/27/21 was 15/3  2: HTN- - BP 122/80 - sees PCP @ [facility] - BMP 09/03/22 reviewed: sodium 142, potassium 3.9, creatinine 0.88 and GFR 75 - BMET today as it hasn't been done since last visit here  3: TBI- - sister is caregiver - saw neurology April Chang) 02/25  4: Leg swelling- - controlled with furosemide  20mg  daily; was taking 40mg  but felt like she was urinating too  much - can take 20mg  daily with additional 20mg  if needed for weight gain or swelling   Return in 6 months, sooner if needed.   April DELENA Class, FNP 11/12/23

## 2023-11-12 NOTE — Telephone Encounter (Signed)
 Called to confirm/remind patient of their appointment at the Advanced Heart Failure Clinic on 11/13/23.   Appointment:   [x] Confirmed  [] Left mess   [] No answer/No voice mail  [] VM Full/unable to leave message  [] Phone not in service  Patient reminded to bring all medications and/or complete list.  Confirmed patient has transportation. Gave directions, instructed to utilize valet parking.

## 2023-11-13 ENCOUNTER — Encounter: Payer: Self-pay | Admitting: Family

## 2023-11-13 ENCOUNTER — Ambulatory Visit: Attending: Family | Admitting: Family

## 2023-11-13 VITALS — BP 140/77 | HR 84 | Wt 150.0 lb

## 2023-11-13 DIAGNOSIS — Z79899 Other long term (current) drug therapy: Secondary | ICD-10-CM | POA: Insufficient documentation

## 2023-11-13 DIAGNOSIS — I10 Essential (primary) hypertension: Secondary | ICD-10-CM | POA: Diagnosis not present

## 2023-11-13 DIAGNOSIS — Z87891 Personal history of nicotine dependence: Secondary | ICD-10-CM | POA: Diagnosis not present

## 2023-11-13 DIAGNOSIS — I110 Hypertensive heart disease with heart failure: Secondary | ICD-10-CM | POA: Insufficient documentation

## 2023-11-13 DIAGNOSIS — I428 Other cardiomyopathies: Secondary | ICD-10-CM | POA: Insufficient documentation

## 2023-11-13 DIAGNOSIS — I5032 Chronic diastolic (congestive) heart failure: Secondary | ICD-10-CM | POA: Diagnosis not present

## 2023-11-13 DIAGNOSIS — J45909 Unspecified asthma, uncomplicated: Secondary | ICD-10-CM | POA: Insufficient documentation

## 2023-11-13 DIAGNOSIS — R5383 Other fatigue: Secondary | ICD-10-CM | POA: Diagnosis not present

## 2023-11-13 DIAGNOSIS — Z87820 Personal history of traumatic brain injury: Secondary | ICD-10-CM | POA: Diagnosis not present

## 2023-11-13 DIAGNOSIS — K219 Gastro-esophageal reflux disease without esophagitis: Secondary | ICD-10-CM | POA: Diagnosis not present

## 2023-11-13 DIAGNOSIS — Z8249 Family history of ischemic heart disease and other diseases of the circulatory system: Secondary | ICD-10-CM | POA: Diagnosis not present

## 2023-11-13 DIAGNOSIS — I509 Heart failure, unspecified: Secondary | ICD-10-CM | POA: Diagnosis not present

## 2023-11-13 DIAGNOSIS — S069XAS Unspecified intracranial injury with loss of consciousness status unknown, sequela: Secondary | ICD-10-CM

## 2023-11-13 NOTE — Patient Instructions (Signed)
 It was good to see you today!  Medication Changes:  No medication changes today!  Lab Work:  Go downstairs to [place] on [address] to have your blood work completed you will get Dr. Ulysses and our labs today.  We will only call you if the results are abnormal or if the provider would like to make medication changes.  No news is good news.    Follow-Up in: Please follow up with the Advanced Heart Failure Clinic in 6 months with Ellouise Class, FNP.   Thank you for choosing [facility].    At the Advanced Heart Failure Clinic, you and your health needs are our priority. We have a designated team specialized in the treatment of Heart Failure. This Care Team includes your primary Heart Failure Specialized Cardiologist (physician), Advanced Practice Providers (APPs- Physician Assistants and Nurse Practitioners), and Pharmacist who all work together to provide you with the care you need, when you need it.   You may see any of the following providers on your designated Care Team at your next follow up:  Dr. Toribio Fuel Dr. Ezra Shuck Dr. Ria Commander Dr. Morene Brownie Ellouise Class, FNP Jaun Bash, RPH-CPP  Please be sure to bring in all your medications bottles to every appointment.   Need to Contact Us :  If you have any questions or concerns before your next appointment please send us  a message through [place] or call our office at [phone].    TO LEAVE A MESSAGE FOR THE NURSE SELECT OPTION 2, PLEASE LEAVE A MESSAGE INCLUDING: YOUR NAME DATE OF BIRTH CALL BACK NUMBER REASON FOR CALL**this is important as we prioritize the call backs  YOU WILL RECEIVE A CALL BACK THE SAME DAY AS LONG AS YOU CALL BEFORE 4:00 PM

## 2023-11-19 ENCOUNTER — Encounter: Payer: Self-pay | Admitting: Internal Medicine

## 2023-11-19 ENCOUNTER — Ambulatory Visit
Admission: RE | Admit: 2023-11-19 | Discharge: 2023-11-19 | Disposition: A | Discharge status: Home / Self Care | Attending: Internal Medicine | Admitting: Internal Medicine

## 2023-11-19 DIAGNOSIS — R400 Somnolence: Secondary | ICD-10-CM | POA: Diagnosis not present

## 2023-11-19 DIAGNOSIS — R5383 Other fatigue: Secondary | ICD-10-CM | POA: Diagnosis present

## 2023-11-19 LAB — CBC WITH DIFFERENTIAL/PLATELET
Abs Immature Granulocytes: 0.03 K/uL (ref 0.00–0.07)
Basophils Absolute: 0 K/uL (ref 0.0–0.1)
Basophils Relative: 1 %
Eosinophils Absolute: 0.3 K/uL (ref 0.0–0.5)
Eosinophils Relative: 4 %
HCT: 43.6 % (ref 36.0–46.0)
Hemoglobin: 14.2 g/dL (ref 12.0–15.0)
Immature Granulocytes: 0 %
Lymphocytes Relative: 57 %
Lymphs Abs: 4.1 K/uL — ABNORMAL HIGH (ref 0.7–4.0)
MCH: 29.3 pg (ref 26.0–34.0)
MCHC: 32.6 g/dL (ref 30.0–36.0)
MCV: 90.1 fL (ref 80.0–100.0)
Monocytes Absolute: 0.5 K/uL (ref 0.1–1.0)
Monocytes Relative: 7 %
Neutro Abs: 2.2 K/uL (ref 1.7–7.7)
Neutrophils Relative %: 31 %
Platelets: 212 K/uL (ref 150–400)
RBC: 4.84 MIL/uL (ref 3.87–5.11)
RDW: 13.6 % (ref 11.5–15.5)
WBC: 7.1 K/uL (ref 4.0–10.5)
nRBC: 0 % (ref 0.0–0.2)

## 2023-11-19 LAB — TSH: TSH: 1.805 u[IU]/mL (ref 0.350–4.500)

## 2023-11-21 ENCOUNTER — Ambulatory Visit: Payer: Self-pay | Admitting: Internal Medicine

## 2023-11-21 ENCOUNTER — Encounter: Payer: Self-pay | Admitting: Internal Medicine

## 2023-11-26 ENCOUNTER — Other Ambulatory Visit: Payer: Self-pay | Admitting: Family

## 2023-12-03 ENCOUNTER — Ambulatory Visit
Admission: RE | Admit: 2023-12-03 | Discharge: 2023-12-03 | Disposition: A | Discharge status: Home / Self Care | Source: Ambulatory Visit | Attending: Nurse Practitioner | Admitting: Nurse Practitioner

## 2023-12-03 DIAGNOSIS — Z1231 Encounter for screening mammogram for malignant neoplasm of breast: Secondary | ICD-10-CM | POA: Insufficient documentation

## 2024-02-29 IMAGING — MG MM DIGITAL SCREENING BILAT W/ TOMO AND CAD
8 series · 8 of 24 positions shown · non-contrast
Comparison: Previous exam(s).

ACR Breast Density Category a: The breast tissue is almost entirely
fatty.

CLINICAL DATA: Screening.

EXAM:
DIGITAL SCREENING BILATERAL MAMMOGRAM WITH TOMOSYNTHESIS AND CAD
TECHNIQUE: Bilateral screening digital craniocaudal and mediolateral oblique
mammograms were obtained. Bilateral screening digital breast
tomosynthesis was performed. The images were evaluated with
computer-aided detection.

[R CC synth-2D]
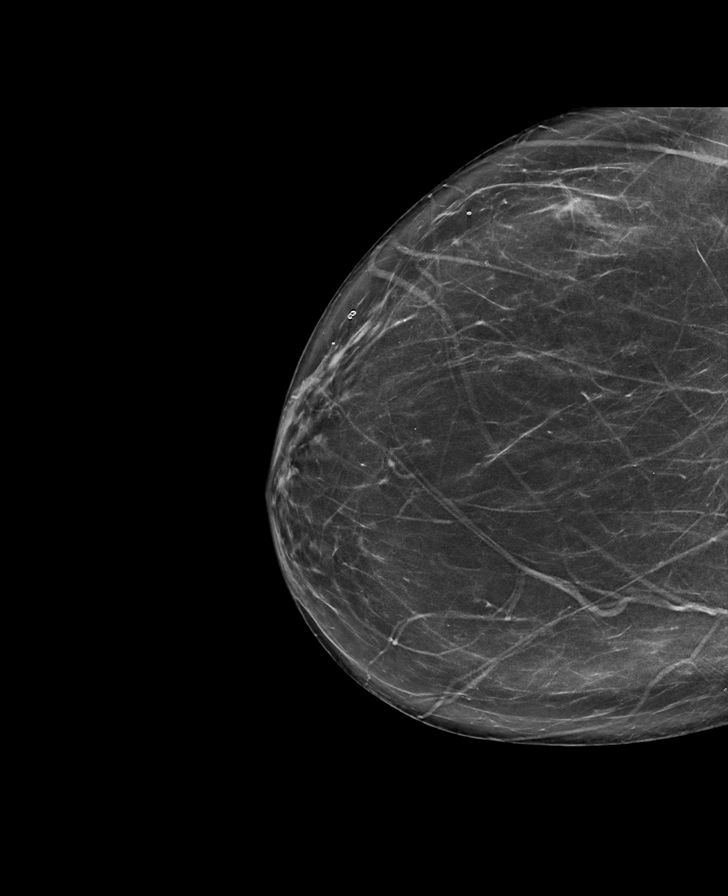

[L MLO synth-2D]
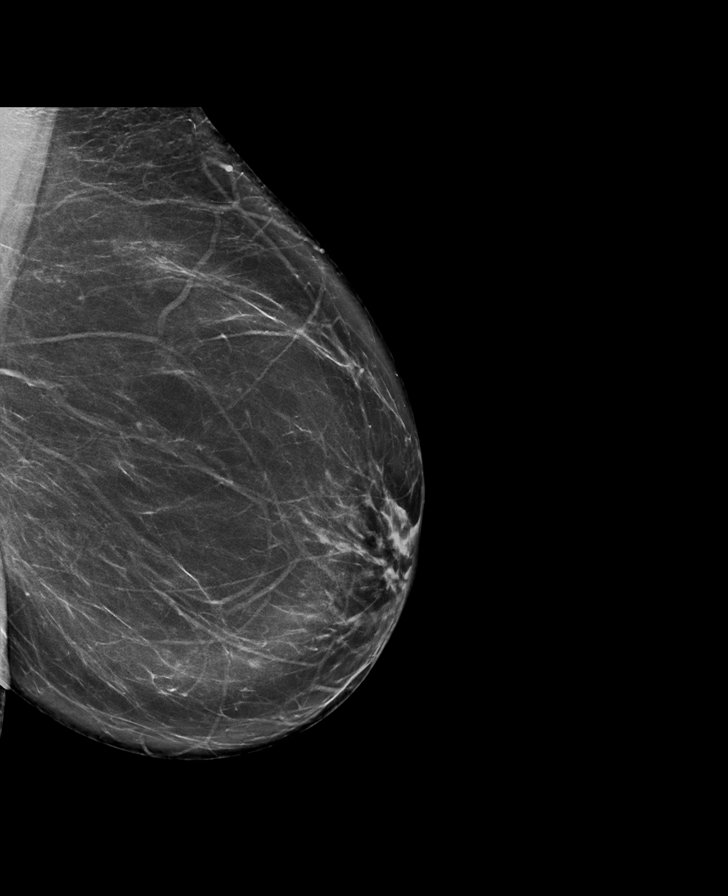

[R MLO synth-2D]
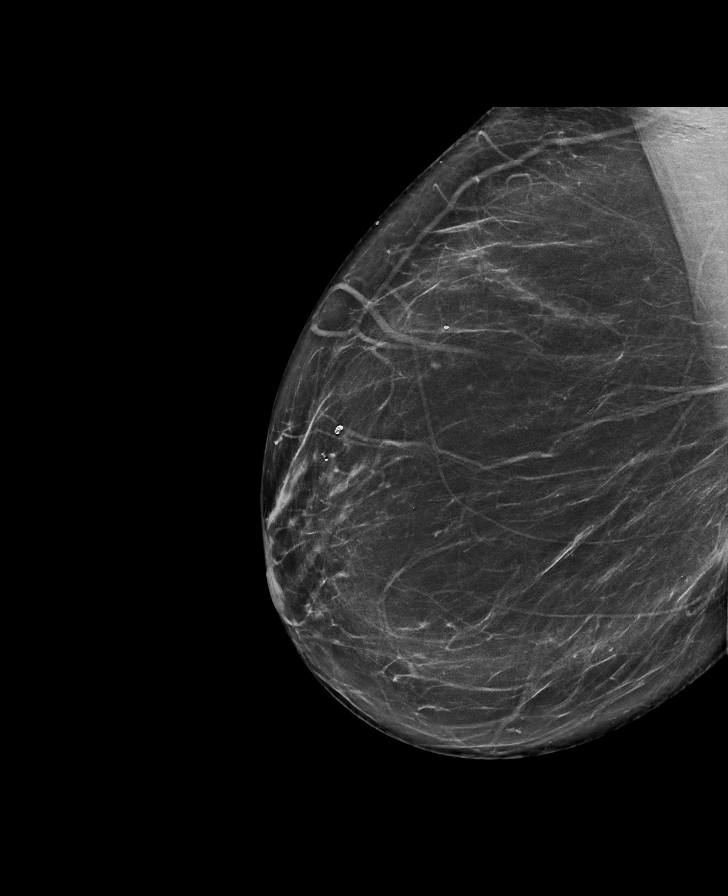

[L CC synth-2D]
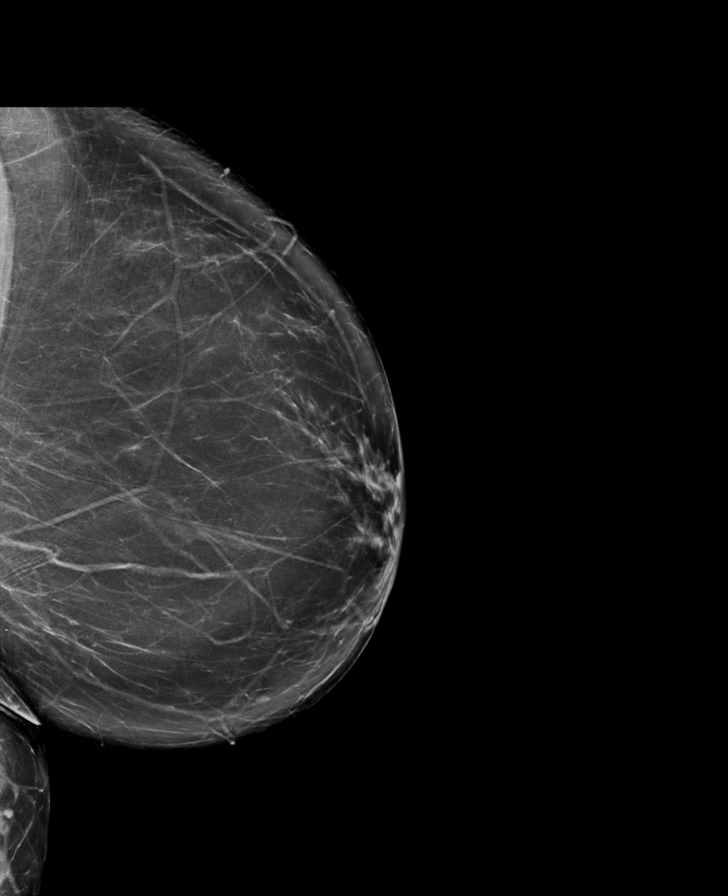

[R MLO tomo · tomo slice 39/78.0]
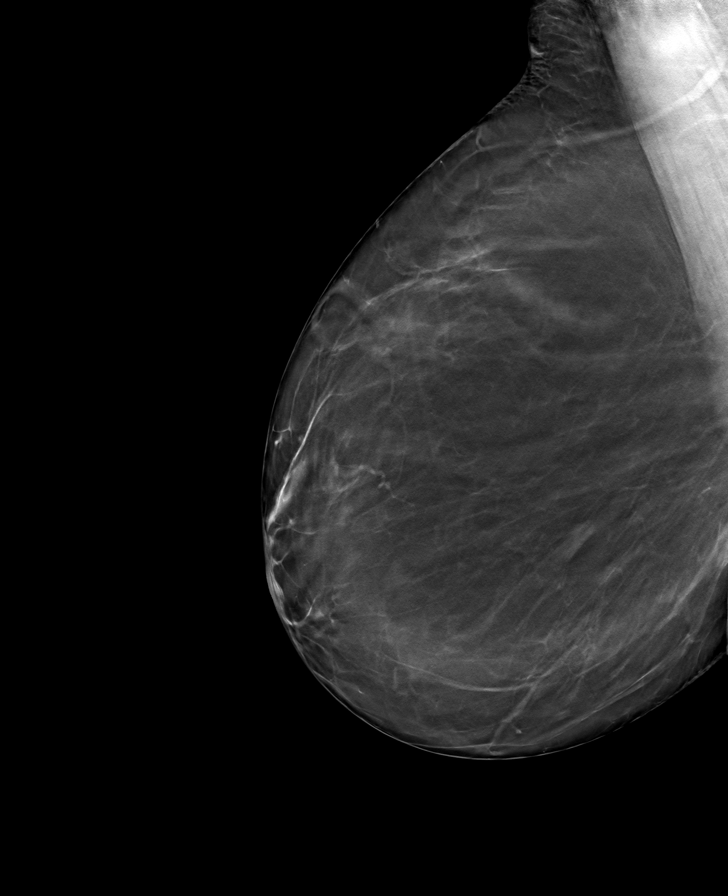

[L MLO tomo · tomo slice 40/79.0]
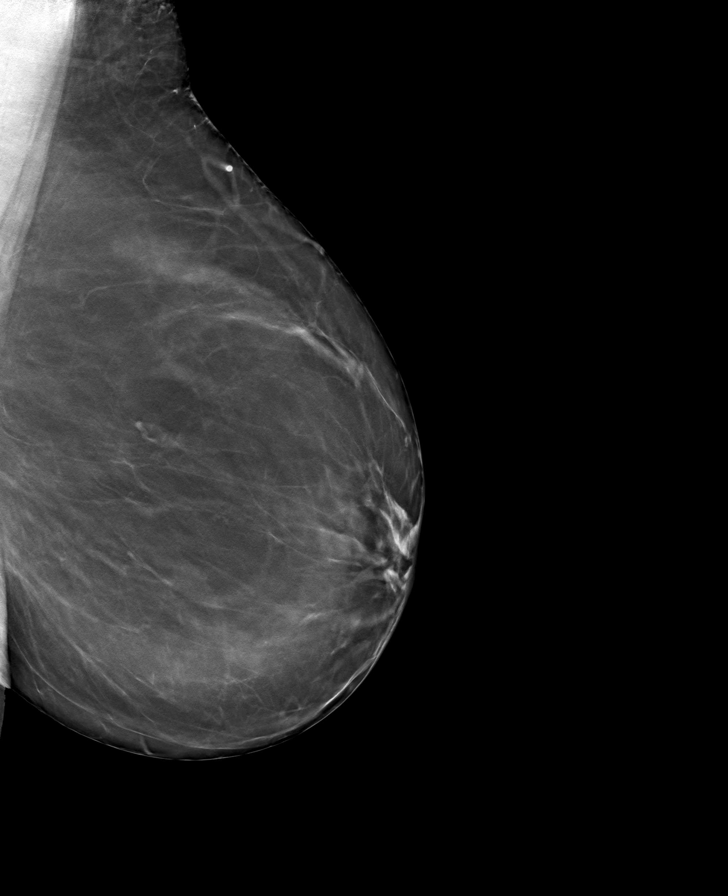

[R CC tomo · tomo slice 37/74.0]
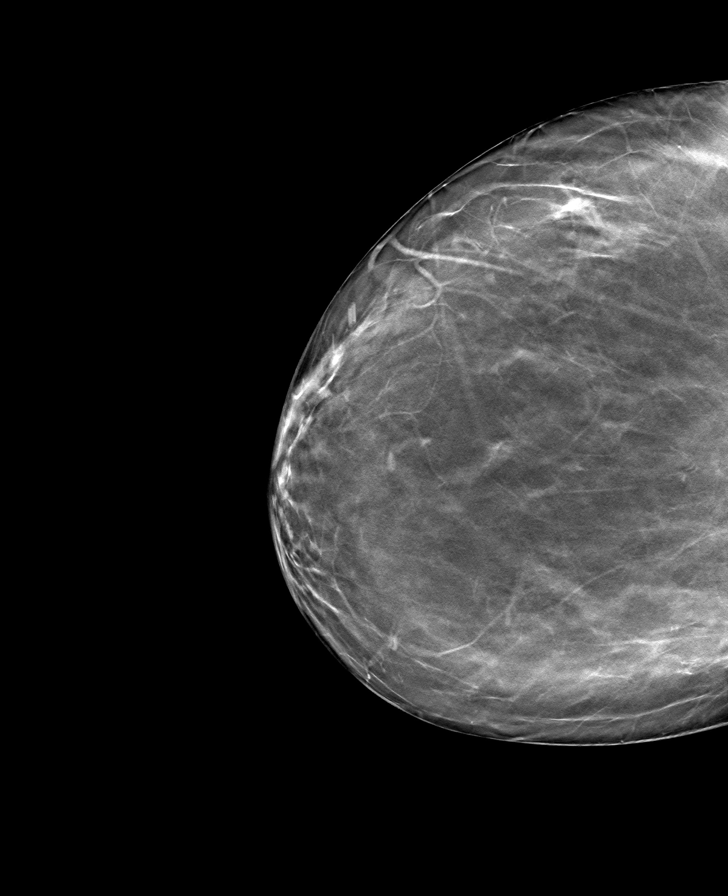

[L CC tomo · tomo slice 41/82.0]
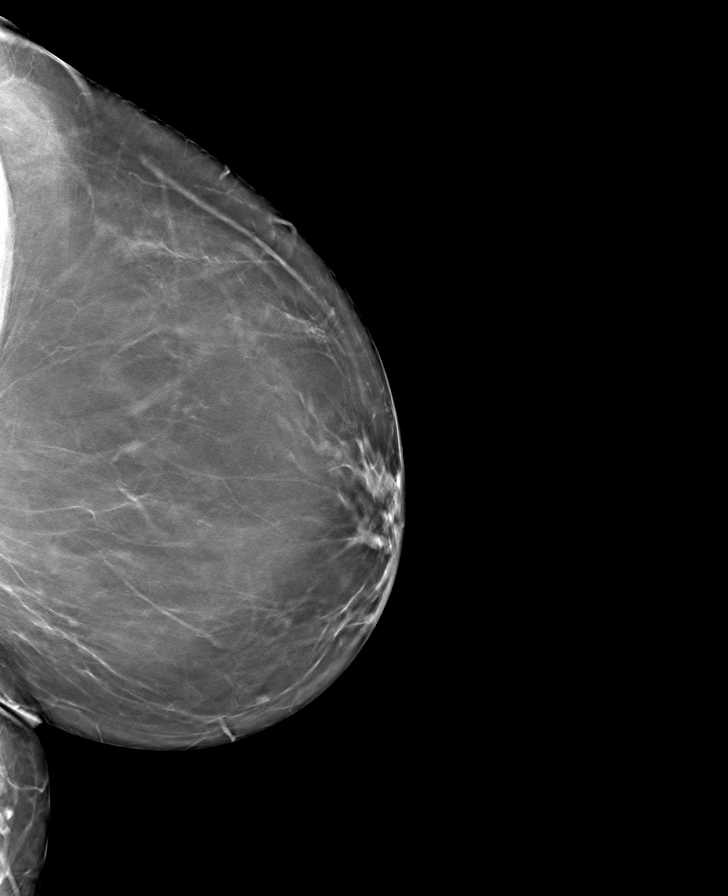

[8 of 24 positions shown; findings below may reference images not displayed]

FINDINGS: There are no findings suspicious for malignancy.
IMPRESSION: No mammographic evidence of malignancy. A result letter of this
screening mammogram will be mailed directly to the patient.

RECOMMENDATION:
Screening mammogram in one year. (Code:0E-3-N98)

BI-RADS CATEGORY  1: Negative.

## 2024-05-04 ENCOUNTER — Ambulatory Visit: Admitting: Dermatology

## 2024-05-12 ENCOUNTER — Ambulatory Visit: Admitting: Family
